# Patient Record
Sex: Male | Born: 1980 | Race: White | Hispanic: No | Marital: Married | Smoking: Former smoker
Health system: Southern US, Community
[De-identification: ages and names within clinical notes are randomized; demographics above are authoritative.]

## PROBLEM LIST (undated history)

## (undated) DIAGNOSIS — I8390 Asymptomatic varicose veins of unspecified lower extremity: Secondary | ICD-10-CM

## (undated) HISTORY — DX: Asymptomatic varicose veins of unspecified lower extremity: I83.90

---

## 2014-10-09 ENCOUNTER — Telehealth: Payer: Self-pay | Admitting: Family Medicine

## 2014-10-09 NOTE — Telephone Encounter (Signed)
Patient advised to go to urgent care

## 2014-10-16 ENCOUNTER — Telehealth: Payer: Self-pay | Admitting: Family Medicine

## 2014-10-16 NOTE — Telephone Encounter (Signed)
Patient has [employer] and is currently not on any medications. Patient scheduled for 3/8 with Dr. Darlyn ReadStacks.

## 2014-11-26 ENCOUNTER — Emergency Department (HOSPITAL_COMMUNITY): Payer: Worker's Compensation

## 2014-11-26 ENCOUNTER — Encounter (HOSPITAL_COMMUNITY): Payer: Self-pay | Admitting: Emergency Medicine

## 2014-11-26 ENCOUNTER — Emergency Department (HOSPITAL_COMMUNITY)
Admission: EM | Admit: 2014-11-26 | Discharge: 2014-11-26 | Disposition: A | Discharge status: Home / Self Care | Payer: Worker's Compensation | Attending: Emergency Medicine | Admitting: Emergency Medicine

## 2014-11-26 DIAGNOSIS — Y9389 Activity, other specified: Secondary | ICD-10-CM | POA: Insufficient documentation

## 2014-11-26 DIAGNOSIS — Z87891 Personal history of nicotine dependence: Secondary | ICD-10-CM | POA: Diagnosis not present

## 2014-11-26 DIAGNOSIS — S40012A Contusion of left shoulder, initial encounter: Secondary | ICD-10-CM | POA: Diagnosis not present

## 2014-11-26 DIAGNOSIS — S4992XA Unspecified injury of left shoulder and upper arm, initial encounter: Secondary | ICD-10-CM | POA: Diagnosis present

## 2014-11-26 DIAGNOSIS — S8002XA Contusion of left knee, initial encounter: Secondary | ICD-10-CM | POA: Diagnosis not present

## 2014-11-26 DIAGNOSIS — Y998 Other external cause status: Secondary | ICD-10-CM | POA: Insufficient documentation

## 2014-11-26 DIAGNOSIS — W228XXA Striking against or struck by other objects, initial encounter: Secondary | ICD-10-CM | POA: Insufficient documentation

## 2014-11-26 DIAGNOSIS — S8001XA Contusion of right knee, initial encounter: Secondary | ICD-10-CM | POA: Diagnosis not present

## 2014-11-26 DIAGNOSIS — Y9289 Other specified places as the place of occurrence of the external cause: Secondary | ICD-10-CM | POA: Insufficient documentation

## 2014-11-26 DIAGNOSIS — S40022A Contusion of left upper arm, initial encounter: Secondary | ICD-10-CM

## 2014-11-26 MED ORDER — HYDROCODONE-ACETAMINOPHEN 5-325 MG PO TABS: ORAL_TABLET | ORAL | Status: DC

## 2014-11-26 MED ORDER — CELECOXIB 100 MG PO CAPS: 100.0000 mg | ORAL_CAPSULE | Freq: Two times a day (BID) | ORAL | Status: DC

## 2014-11-26 NOTE — Discharge Instructions (Signed)
Your shoulder xray is negative for fracture or dislocation. Please rest your shoulder. Apply ice today, then alternate heat and ice tomorrow. Celebrex daily with food. Use norco at bed-time if needed,or may use every 4 hours for pain not relieved by tylenol or ibuprofen. Please see Dr Hilda LiasKeeling for orthopedic evaluation if not improving. Contusion A contusion is a deep bruise. Contusions happen when an injury causes bleeding under the skin. Signs of bruising include pain, puffiness (swelling), and discolored skin. The contusion may turn blue, purple, or yellow. HOME CARE   Put ice on the injured area.  Put ice in a plastic bag.  Place a towel between your skin and the bag.  Leave the ice on for 15-20 minutes, 03-04 times a day.  Only take medicine as told by your doctor.  Rest the injured area.  If possible, raise (elevate) the injured area to lessen puffiness. GET HELP RIGHT AWAY IF:   You have more bruising or puffiness.  You have pain that is getting worse.  Your puffiness or pain is not helped by medicine. MAKE SURE YOU:   Understand these instructions.  Will watch your condition.  Will get help right away if you are not doing well or get worse. Document Released: 03/15/2008 Document Revised: 12/20/2011 Document Reviewed: 08/02/2011 [facility] Patient Information 2015 [place]. This information is not intended to replace advice given to you by your health care provider. Make sure you discuss any questions you have with your health care provider.

## 2014-11-26 NOTE — ED Provider Notes (Signed)
CSN: [id number]     Arrival date & time 11/26/14  1329 History   First MD Initiated Contact with Patient 11/26/14 1617     Chief Complaint  Patient presents with  . Knee Injury  . 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 No LOC reported.  Patient is a 34 y.o. male presenting with shoulder injury. The history is provided by the patient.  Shoulder Injury This is a new problem. The current episode started today. The problem occurs intermittently. The problem has been gradually worsening. Associated symptoms include arthralgias. Pertinent negatives include no abdominal pain, nausea, numbness, vomiting or weakness. Exacerbated by: certain movement. He has tried nothing for the symptoms. The treatment provided no relief.    History reviewed. No pertinent past medical history. History reviewed. No pertinent past surgical history. Family History  Problem Relation Age of Onset  . Heart failure Father    History  Substance Use Topics  . Smoking status: Former Games developermoker  . Smokeless tobacco: Not on file  . Alcohol Use: 6.0 oz/week    10 Cans of beer per week    Review of Systems  Gastrointestinal: Negative for nausea, vomiting and abdominal pain.  Musculoskeletal: Positive for arthralgias.  Neurological: Negative for weakness and numbness.      Allergies  Review of patient's allergies indicates no known allergies.  Home Medications   Prior to Admission medications   Not on File   BP 119/86 mmHg  Pulse 74  Temp(Src) 97.7 F (36.5 C) (Oral)  Resp 16  Ht 6' (1.829 m)  Wt 215 lb (97.523 kg)  BMI 29.15 kg/m2  SpO2 99% Physical Exam  Constitutional: He is oriented to person, place, and time.  He appears well-developed and well-nourished.  Non-toxic appearance.  HENT:  Head: Normocephalic.  Right Ear: Tympanic membrane and external ear normal.  Left Ear: Tympanic membrane and external ear normal.  Eyes: EOM and lids are normal. Pupils are equal, round, and reactive to light.  Neck: Normal range of motion. Neck supple. Carotid bruit is not present.  Cardiovascular: Normal rate, regular rhythm, normal heart sounds, intact distal pulses and normal pulses.   Pulmonary/Chest: Breath sounds normal. No respiratory distress.  Abdominal: Soft. Bowel sounds are normal. There is no tenderness. There is no guarding.  Musculoskeletal:       Left shoulder: He exhibits decreased range of motion, tenderness and pain. He exhibits no deformity.  Soreness of both knees. No deformity or effusion. Distal pulses wnl.  Lymphadenopathy:       Head (right side): No submandibular adenopathy present.       Head (left side): No submandibular adenopathy present.    He has no cervical adenopathy.  Neurological: He is alert and oriented to person, place, and time. He has normal strength. No cranial nerve deficit or sensory deficit.  Skin: Skin is warm and dry.  Psychiatric: He has a normal mood and affect. His speech is normal.  Nursing note and vitals reviewed.   ED Course  Procedures (including critical care time) Labs Review Labs Reviewed - No data to display  Imaging Review No results found.   EKG Interpretation None      MDM  Xray of the shoulder is negative for fx  or dislocation. Pt is ambulatory without problem. No gross motor or sensory deficit. Rx for celebrex and norco given to the patient. Work excuse for 2 days given.   Final diagnoses:  None    *I have reviewed nursing notes, vital signs, and all appropriate lab and imaging results for this patient.**    Kathie Dike, PA-C 11/28/14 1139  Samuel Jester, DO 11/28/14 [phone]

## 2014-11-26 NOTE — ED Notes (Signed)
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  Rates pain shoulder 5 and knee 2.  Have not taken any pain medication.

## 2014-12-17 ENCOUNTER — Ambulatory Visit (INDEPENDENT_AMBULATORY_CARE_PROVIDER_SITE_OTHER): Payer: BLUE CROSS/BLUE SHIELD | Admitting: Family Medicine

## 2014-12-17 ENCOUNTER — Encounter: Payer: Self-pay | Admitting: Family Medicine

## 2014-12-17 VITALS — BP 117/78 | HR 86 | Temp 98.1°F | Ht 69.25 in | Wt 217.6 lb

## 2014-12-17 DIAGNOSIS — Z0000 Encounter for general adult medical examination without abnormal findings: Secondary | ICD-10-CM | POA: Diagnosis not present

## 2014-12-17 DIAGNOSIS — I872 Venous insufficiency (chronic) (peripheral): Secondary | ICD-10-CM | POA: Diagnosis not present

## 2014-12-17 DIAGNOSIS — N5089 Other specified disorders of the male genital organs: Secondary | ICD-10-CM

## 2014-12-17 DIAGNOSIS — Z23 Encounter for immunization: Secondary | ICD-10-CM

## 2014-12-17 DIAGNOSIS — N508 Other specified disorders of male genital organs: Secondary | ICD-10-CM

## 2014-12-17 MED ORDER — AZITHROMYCIN 250 MG PO TABS: ORAL_TABLET | ORAL | Status: DC

## 2014-12-17 NOTE — Patient Instructions (Signed)
Exercise to Lose Weight Exercise and a healthy diet may help you lose weight. Your doctor may suggest specific exercises. EXERCISE IDEAS AND TIPS  Choose low-cost things you enjoy doing, such as walking, bicycling, or exercising to workout videos.  Take stairs instead of the elevator.  Walk during your lunch break.  Park your car further away from work or school.  Go to a gym or an exercise class.  Start with 5 to 10 minutes of exercise each day. Build up to 30 minutes of exercise 4 to 6 days a week.  Wear shoes with good support and comfortable clothes.  Stretch before and after working out.  Work out until you breathe harder and your heart beats faster.  Drink extra water when you exercise.  Do not do so much that you hurt yourself, feel dizzy, or get very short of breath. Exercises that burn about 150 calories:  Running 1  miles in 15 minutes.  Playing volleyball for 45 to 60 minutes.  Washing and waxing a car for 45 to 60 minutes.  Playing touch football for 45 minutes.  Walking 1  miles in 35 minutes.  Pushing a stroller 1  miles in 30 minutes.  Playing basketball for 30 minutes.  Raking leaves for 30 minutes.  Bicycling 5 miles in 30 minutes.  Walking 2 miles in 30 minutes.  Dancing for 30 minutes.  Shoveling snow for 15 minutes.  Swimming laps for 20 minutes.  Walking up stairs for 15 minutes.  Bicycling 4 miles in 15 minutes.  Gardening for 30 to 45 minutes.  Jumping rope for 15 minutes.  Washing windows or floors for 45 to 60 minutes. Document Released: 10/30/2010 Document Revised: 12/20/2011 Document Reviewed: 10/30/2010 ExitCare Patient Information 2015 [employer]. This information is not intended to replace advice given to you by your health care provider. Make sure you discuss any questions you have with your health care provider. Calorie Counting for Weight Loss Calories are energy you get from the things you eat and drink. Your  body uses this energy to keep you going throughout the day. The number of calories you eat affects your weight. When you eat more calories than your body needs, your body stores the extra calories as fat. When you eat fewer calories than your body needs, your body burns fat to get the energy it needs. Calorie counting means keeping track of how many calories you eat and drink each day. If you make sure to eat fewer calories than your body needs, you should lose weight. In order for calorie counting to work, you will need to eat the number of calories that are right for you in a day to lose a healthy amount of weight per week. A healthy amount of weight to lose per week is usually 1-2 lb (0.5-0.9 kg). A dietitian can determine how many calories you need in a day and give you suggestions on how to reach your calorie goal.  WHAT IS MY MY PLAN? My goal is to have __________ calories per day.  If I have this many calories per day, I should lose around __________ pounds per week. WHAT DO I NEED TO KNOW ABOUT CALORIE COUNTING? In order to meet your daily calorie goal, you will need to:  Find out how many calories are in each food you would like to eat. Try to do this before you eat.  Decide how much of the food you can eat.  Write down what you ate and   how many calories it had. Doing this is called keeping a food log. WHERE DO I FIND CALORIE INFORMATION? The number of calories in a food can be found on a Nutrition Facts label. Note that all the information on a label is based on a specific serving of the food. If a food does not have a Nutrition Facts label, try to look up the calories online or ask your dietitian for help. HOW DO I DECIDE HOW MUCH TO EAT? To decide how much of the food you can eat, you will need to consider both the number of calories in one serving and the size of one serving. This information can be found on the Nutrition Facts label. If a food does not have a Nutrition Facts label, look  up the information online or ask your dietitian for help. Remember that calories are listed per serving. If you choose to have more than one serving of a food, you will have to multiply the calories per serving by the amount of servings you plan to eat. For example, the label on a package of bread might say that a serving size is 1 slice and that there are 90 calories in a serving. If you eat 1 slice, you will have eaten 90 calories. If you eat 2 slices, you will have eaten 180 calories. HOW DO I KEEP A FOOD LOG? After each meal, record the following information in your food log:  What you ate.  How much of it you ate.  How many calories it had.  Then, add up your calories. Keep your food log near you, such as in a small notebook in your pocket. Another option is to use a mobile app or website. Some programs will calculate calories for you and show you how many calories you have left each time you add an item to the log. WHAT ARE SOME CALORIE COUNTING TIPS?  Use your calories on foods and drinks that will fill you up and not leave you hungry. Some examples of this include foods like nuts and nut butters, vegetables, lean proteins, and high-fiber foods (more than 5 g fiber per serving).  Eat nutritious foods and avoid empty calories. Empty calories are calories you get from foods or beverages that do not have many nutrients, such as candy and soda. It is better to have a nutritious high-calorie food (such as an avocado) than a food with few nutrients (such as a bag of chips).  Know how many calories are in the foods you eat most often. This way, you do not have to look up how many calories they have each time you eat them.  Look out for foods that may seem like low-calorie foods but are really high-calorie foods, such as baked goods, soda, and fat-free candy.  Pay attention to calories in drinks. Drinks such as sodas, specialty coffee drinks, alcohol, and juices have a lot of calories yet do  not fill you up. Choose low-calorie drinks like water and diet drinks.  Focus your calorie counting efforts on higher calorie items. Logging the calories in a garden salad that contains only vegetables is less important than calculating the calories in a milk shake.  Find a way of tracking calories that works for you. Get creative. Most people who are successful find ways to keep track of how much they eat in a day, even if they do not count every calorie. WHAT ARE SOME PORTION CONTROL TIPS?  Know how many calories are in a   serving. This will help you know how many servings of a certain food you can have.  Use a measuring cup to measure serving sizes. This is helpful when you start out. With time, you will be able to estimate serving sizes for some foods.  Take some time to put servings of different foods on your favorite plates, bowls, and cups so you know what a serving looks like.  Try not to eat straight from a bag or box. Doing this can lead to overeating. Put the amount you would like to eat in a cup or on a plate to make sure you are eating the right portion.  Use smaller plates, glasses, and bowls to prevent overeating. This is a quick and easy way to practice portion control. If your plate is smaller, less food can fit on it.  Try not to multitask while eating, such as watching TV or using your computer. If it is time to eat, sit down at a table and enjoy your food. Doing this will help you to start recognizing when you are full. It will also make you more aware of what and how much you are eating. HOW CAN I CALORIE COUNT WHEN EATING OUT?  Ask for smaller portion sizes or child-sized portions.  Consider sharing an entree and sides instead of getting your own entree.  If you get your own entree, eat only half. Ask for a box at the beginning of your meal and put the rest of your entree in it so you are not tempted to eat it.  Look for the calories on the menu. If calories are listed,  choose the lower calorie options.  Choose dishes that include vegetables, fruits, whole grains, low-fat dairy products, and lean protein. Focusing on smart food choices from each of the 5 food groups can help you stay on track at restaurants.  Choose items that are boiled, broiled, grilled, or steamed.  Choose water, milk, unsweetened iced tea, or other drinks without added sugars. If you want an alcoholic beverage, choose a lower calorie option. For example, a regular margarita can have up to 700 calories and a glass of wine has around 150.  Stay away from items that are buttered, battered, fried, or served with cream sauce. Items labeled "crispy" are usually fried, unless stated otherwise.  Ask for dressings, sauces, and syrups on the side. These are usually very high in calories, so do not eat much of them.  Watch out for salads. Many people think salads are a healthy option, but this is often not the case. Many salads come with bacon, fried chicken, lots of cheese, fried chips, and dressing. All of these items have a lot of calories. If you want a salad, choose a garden salad and ask for grilled meats or steak. Ask for the dressing on the side, or ask for olive oil and vinegar or lemon to use as dressing.  Estimate how many servings of a food you are given. For example, a serving of cooked rice is  cup or about the size of half a tennis ball or one cupcake wrapper. Knowing serving sizes will help you be aware of how much food you are eating at restaurants. The list below tells you how big or small some common portion sizes are based on everyday objects.  1 oz--4 stacked dice.  3 oz--1 deck of cards.  1 tsp--1 dice.  1 Tbsp-- a Ping-Pong ball.  2 Tbsp--1 Ping-Pong ball.   cup--1 tennis ball   or 1 cupcake wrapper.  1 cup--1 baseball. Document Released: 09/27/2005 Document Revised: 02/11/2014 Document Reviewed: 08/02/2013 ExitCare Patient Information 2015 [employer]. This  information is not intended to replace advice given to you by your health care provider. Make sure you discuss any questions you have with your health care provider.  
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
## 2014-12-21 ENCOUNTER — Other Ambulatory Visit: Payer: BLUE CROSS/BLUE SHIELD

## 2014-12-21 LAB — POCT CBC
Granulocyte percent: 57.9 %G (ref 37–80)
HCT, POC: 47.9 % (ref 43.5–53.7)
HEMOGLOBIN: 15.2 g/dL (ref 14.1–18.1)
Lymph, poc: 1.9 (ref 0.6–3.4)
MCH, POC: 27.9 pg (ref 27–31.2)
MCHC: 31.7 g/dL — AB (ref 31.8–35.4)
MCV: 87.8 fL (ref 80–97)
MPV: 6.7 fL (ref 0–99.8)
POC Granulocyte: 3.2 (ref 2–6.9)
POC LYMPH %: 34.8 % (ref 10–50)
Platelet Count, POC: 294 10*3/uL (ref 142–424)
RBC: 5.46 M/uL (ref 4.69–6.13)
RDW, POC: 12.7 %
WBC: 5.5 10*3/uL (ref 4.6–10.2)

## 2014-12-21 NOTE — Progress Notes (Signed)
Lab draw that were ordered early in week by dr. Darlyn Readstacks

## 2014-12-21 NOTE — Addendum Note (Signed)
Addended by: Prescott GumLAND, Purva Vessell M on: 12/21/2014 08:35 AM   Modules accepted: Kipp BroodSmartSet

## 2014-12-23 LAB — NMR, LIPOPROFILE
Cholesterol: 165 mg/dL (ref 100–199)
HDL Cholesterol by NMR: 58 mg/dL (ref >39)
HDL Particle Number: 33.2 umol/L (ref >=30.5)
LDL Particle Number: 1219 nmol/L — ABNORMAL HIGH (ref <1000)
LDL SIZE: 21.2 nm (ref >20.5)
LDL-C: 99 mg/dL (ref 0–99)
LP-IR Score: 29 (ref <=45)
SMALL LDL PARTICLE NUMBER: 274 nmol/L (ref <=527)
TRIGLYCERIDES BY NMR: 38 mg/dL (ref 0–149)

## 2014-12-23 LAB — CMP14+EGFR
A/G RATIO: 1.8 (ref 1.1–2.5)
ALBUMIN: 4.2 g/dL (ref 3.5–5.5)
ALT: 21 IU/L (ref 0–44)
AST: 19 IU/L (ref 0–40)
Alkaline Phosphatase: 72 IU/L (ref 39–117)
BUN/Creatinine Ratio: 14 (ref 8–19)
BUN: 14 mg/dL (ref 6–20)
Bilirubin Total: 0.3 mg/dL (ref 0.0–1.2)
CALCIUM: 9.1 mg/dL (ref 8.7–10.2)
CHLORIDE: 103 mmol/L (ref 97–108)
CO2: 23 mmol/L (ref 18–29)
Creatinine, Ser: 0.98 mg/dL (ref 0.76–1.27)
GFR calc Af Amer: 116 mL/min/{1.73_m2} (ref >59)
GFR calc non Af Amer: 100 mL/min/{1.73_m2} (ref >59)
Globulin, Total: 2.4 g/dL (ref 1.5–4.5)
Glucose: 92 mg/dL (ref 65–99)
POTASSIUM: 4.2 mmol/L (ref 3.5–5.2)
Sodium: 140 mmol/L (ref 134–144)
TOTAL PROTEIN: 6.6 g/dL (ref 6.0–8.5)

## 2014-12-24 ENCOUNTER — Ambulatory Visit: Payer: Worker's Compensation | Attending: Family Medicine | Admitting: Physical Therapy

## 2014-12-24 DIAGNOSIS — M25512 Pain in left shoulder: Secondary | ICD-10-CM | POA: Insufficient documentation

## 2014-12-24 NOTE — Therapy (Signed)
[facility] [address] [place], [place], [address] Phone: [phone]   Fax:  [phone]  Physical Therapy Evaluation  Patient Details  Name: Ruben Mejia MRN: [id number] Date of Birth: 1981-06-19 Referring Provider:  Ernestina PennaMoore, Donald W, MD  Encounter Date: 12/24/2014      PT End of Session - 12/24/14 1419    Visit Number 1   Number of Visits 12   Date for PT Re-Evaluation 01/21/15   Authorization - Visit Number --   PT Start Time 0152   PT Stop Time 0239   PT Time Calculation (min) 47 min   Activity Tolerance Patient tolerated treatment well   Behavior During Therapy [facility] for tasks assessed/performed      No past medical history on file.  No past surgical history on file.  There were no vitals filed for this visit.  Visit Diagnosis:  Left shoulder pain - Plan: PT plan of care cert/re-cert      Subjective Assessment - 12/24/14 1413    Symptoms Minimal pain at rest.   Patient Stated Goals Get out of pain.   Currently in Pain? Yes   Pain Score 2    Pain Location Shoulder   Pain Orientation Left   Pain Descriptors / Indicators Sharp   Pain Type Acute pain   Pain Onset 1 to 4 weeks ago   Pain Frequency Constant   Aggravating Factors  Lifting objects at work.   Pain Relieving Factors Rest arm.            [facility] PT Assessment - 12/24/14 0001    Assessment   Medical Diagnosis Left shoulder strain/contusion.   Onset Date 11/26/14   Balance Screen   Has the patient fallen in the past 6 months No   Has the patient had a decrease in activity level because of a fear of falling?  No   Is the patient reluctant to leave their home because of a fear of falling?  No   Posture/Postural Control   Posture/Postural Control Postural limitations   Postural Limitations Rounded Shoulders   Posture Comments Protracted scapulae.   ROM / Strength   AROM / PROM / Strength AROM;Strength   AROM   Overall AROM Comments Normal left UE  AROM.   Strength   Overall Strength Comments Left ER strength= 4+/5.   Palpation   Palpation Tender to palpation over left posterior cuff region.   Special Tests    Special Tests --  Negative special testing.  Normal UE DTR's.                   [facility] Treatment/Exercise - 12/24/14 0001    Modalities   Modalities Electrical Stimulation   Electrical Stimulation   Electrical Stimulation Location Left shoulder.   Electrical Stimulation Parameters Pre-mod 80-150 Hz constant x 20 minutes.   Electrical Stimulation Goals Pain                     PT Long Term Goals - 12/24/14 1421    PT LONG TERM GOAL #1   Title Ind with HEP.   PT LONG TERM GOAL #2   Title Perform all ADL's with pain not > 1-2/10.   PT LONG TERM GOAL #3   Title Sleep undisturbed.   Time 6   Period Weeks   Status New               Plan - 12/24/14 1357  Clinical Impression Statement Forlift accident (11/26/14) and fell on left shoulder.  To hospital via ED.  Resting pain-level low 1-2/10.  Certain movements produce left shoulder pain ("sharp") rated at 5/10.  The patient's sleep is disturbed by pain.   Rehab Potential Excellent   PT Duration --  12 visits.   PT Treatment/Interventions Therapeutic activities;Patient/family education;Therapeutic exercise;Ultrasound;Manual techniques;Cryotherapy;Electrical Stimulation   PT Next Visit Plan Left shoulder modalites and STW/M; RW4 and SDLY ER.   Consulted and Agree with Plan of Care Patient         Problem List There are no active problems to display for this patient.   Tristan Bramble, [place] MPT 12/24/2014, 2:45 PM  [facility] [address] [place], [place], [address] Phone: [phone]   Fax:  [phone]

## 2014-12-25 ENCOUNTER — Ambulatory Visit (HOSPITAL_COMMUNITY)
Admission: RE | Admit: 2014-12-25 | Discharge: 2014-12-25 | Disposition: A | Discharge status: Home / Self Care | Payer: BLUE CROSS/BLUE SHIELD | Source: Ambulatory Visit | Attending: Family Medicine | Admitting: Family Medicine

## 2014-12-25 ENCOUNTER — Other Ambulatory Visit: Payer: Self-pay | Admitting: Family Medicine

## 2014-12-25 DIAGNOSIS — I861 Scrotal varices: Secondary | ICD-10-CM | POA: Diagnosis not present

## 2014-12-25 DIAGNOSIS — N508 Other specified disorders of male genital organs: Secondary | ICD-10-CM | POA: Diagnosis present

## 2014-12-25 DIAGNOSIS — N5089 Other specified disorders of the male genital organs: Secondary | ICD-10-CM

## 2014-12-27 ENCOUNTER — Other Ambulatory Visit: Payer: Self-pay

## 2014-12-27 DIAGNOSIS — I872 Venous insufficiency (chronic) (peripheral): Secondary | ICD-10-CM

## 2014-12-30 ENCOUNTER — Ambulatory Visit: Payer: Worker's Compensation | Admitting: Physical Therapy

## 2014-12-30 ENCOUNTER — Encounter: Payer: Self-pay | Admitting: Physical Therapy

## 2014-12-30 DIAGNOSIS — M25512 Pain in left shoulder: Secondary | ICD-10-CM | POA: Diagnosis not present

## 2014-12-30 NOTE — Therapy (Signed)
[facility] [address] [place], [place], [address] Phone: [phone]   Fax:  [phone]  Physical Therapy Treatment  Patient Details  Name: Ruben Mejia MRN: [id number] Date of Birth: Jun 19, 1981 Referring Provider:  Mechele Claude, MD  Encounter Date: 12/30/2014      PT End of Session - 12/30/14 1516    Visit Number 2   Number of Visits 12   Date for PT Re-Evaluation 01/21/15   PT Start Time 1512   PT Stop Time 1556   PT Time Calculation (min) 44 min   Activity Tolerance Patient tolerated treatment well   Behavior During Therapy [facility] for tasks assessed/performed      No past medical history on file.  No past surgical history on file.  There were no vitals filed for this visit.  Visit Diagnosis:  Left shoulder pain      Subjective Assessment - 12/30/14 1513    Symptoms Patient states that his shoulder has been doing good. He is on light duty at work still. Reports compliance with the exercises that Dr. Christell Constant gave him (wall climbs, "rotating shoulder around").   Currently in Pain? No/denies                       [facility] Adult PT Treatment/Exercise - 12/30/14 0001    Exercises   Exercises Shoulder   Shoulder Exercises: Standing   External Rotation Strengthening;Left;10 reps;Theraband  3 sets   Theraband Level (Shoulder External Rotation) Level 2 (Red)   Internal Rotation Strengthening;Left;10 reps;Theraband  3 sets   Theraband Level (Shoulder Internal Rotation) Level 2 (Red)   Extension Strengthening;Both;10 reps  3 sets   Extension Limitations Pink XTS   Row Strengthening;Both;10 reps;Limitations  3 sets   Row Limitations Pink XTS   Shoulder Exercises: ROM/Strengthening   UBE (Upper Arm Bike) 90 RPMs x 8 minutes   Shoulder Exercises: Stretch   Corner Stretch 3 reps;30 seconds   Modalities   Modalities Electrical Stimulation   Electrical Stimulation   Electrical Stimulation Location Left  shoulder.   Electrical Stimulation Action Pre-Mod   Electrical Stimulation Parameters 80-150 Hz constant x 20 minutes   Electrical Stimulation Goals Pain                PT Education - 12/30/14 1553    Education provided Yes   Education Details HEP- corner stretch, ER, IR, rows, extension with theraband. Red theraband given   Person(s) Educated Patient   Methods Explanation;Demonstration;Handout   Comprehension Verbalized understanding;Returned demonstration             PT Long Term Goals - 12/30/14 1538    PT LONG TERM GOAL #1   Title Ind with HEP.   Time 6   Period Weeks   Status On-going   PT LONG TERM GOAL #2   Title Perform all ADL's with pain not > 1-2/10.   Time 6   Period Weeks   Status On-going   PT LONG TERM GOAL #3   Title Sleep undisturbed.   Time 6   Period Weeks   Status On-going               Plan - 12/30/14 1558    Clinical Impression Statement Patient tolerated treatment well and accepted new exercises. Patient experienced dull, ache pain (2-3/10) in left shoulder during exercises especially ER. All goals remain on-going at this time. Patient denied ice pack during e-stim. Patient reported 1/10 pain in left shoulder  following e-stim.    Rehab Potential Excellent   PT Treatment/Interventions Therapeutic activities;Patient/family education;Therapeutic exercise;Ultrasound;Manual techniques;Cryotherapy;Electrical Stimulation   PT Next Visit Plan Continue per PT POC. Continue strengthening for L shoulder.    Consulted and Agree with Plan of Care Patient        Problem List There are no active problems to display for this patient.   Evelene CroonKelsey M Parsons, PTA 12/30/2014, 4:02 PM  [facility] [address] [place], [place], [address] Phone: [phone]   Fax:  [phone]

## 2014-12-30 NOTE — Patient Instructions (Signed)
Flexibility: Corner Stretch   Standing in corner with hands just above shoulder level and feet ____ inches from corner, lean forward until a comfortable stretch is felt across chest. Hold _30___ seconds. Repeat _3___ times per set. Do _1___ sets per session. Do _2___ sessions per day.  http://orth.exer.us/342   Copyright  VHI. All rights reserved.  Strengthening: Resisted External Rotation   Hold tubing in right hand, elbow at side and forearm across body. Rotate forearm out. Repeat __10__ times per set. Do __3__ sets per session. Do _2___ sessions per day.  http://orth.exer.us/828   Copyright  VHI. All rights reserved.  Strengthening: Resisted Internal Rotation   Hold tubing in left hand, elbow at side and forearm out. Rotate forearm in across body. Repeat _10___ times per set. Do __3__ sets per session. Do _2___ sessions per day.  http://orth.exer.us/830     Copyright  VHI. All rights reserved.  Scapular Retraction: Bilateral   Facing anchor, pull arms back, bringing shoulder blades together. Repeat _10___ times per set. Do __3__ sets per session. Do _2___ sessions per day.  http://orth.exer.us/176   Copyright  VHI. All rights reserved.  Strengthening: Resisted Extension   Hold tubing in right hand, arm forward. Pull arm back, elbow straight. Repeat ___10_ times per set. Do ___3_ sets per session. Do _2___ sessions per day.  http://orth.exer.us/832   Copyright  VHI. All rights reserved.

## 2014-12-31 ENCOUNTER — Telehealth: Payer: Self-pay | Admitting: Family Medicine

## 2014-12-31 ENCOUNTER — Encounter: Payer: Self-pay | Admitting: Family Medicine

## 2014-12-31 NOTE — Telephone Encounter (Signed)
-----   Message from Mechele ClaudeWarren Stacks, MD sent at 12/25/2014  5:45 PM EDT ----- Jerilynn SomHello [place], The left testicle has a harmless cyst that you are feeling.  Best Regards, Mechele ClaudeWarren Stacks, M.D.

## 2015-01-02 ENCOUNTER — Ambulatory Visit: Payer: Worker's Compensation | Admitting: Physical Therapy

## 2015-01-02 ENCOUNTER — Encounter: Payer: Self-pay | Admitting: Physical Therapy

## 2015-01-02 DIAGNOSIS — M25512 Pain in left shoulder: Secondary | ICD-10-CM | POA: Diagnosis not present

## 2015-01-02 NOTE — Therapy (Signed)
[facility] [place], [place], [address] Phone: [phone]   Fax:  [phone]  Physical Therapy Treatment  Patient Details  Name: Ruben Mejia MRN: [id number] Date of Birth: Sep 26, 1981 Referring Provider:  Claretta Fraise, MD  Encounter Date: 01/02/2015      PT End of Session - 01/02/15 1516    Visit Number 3   Number of Visits 12   Date for PT Re-Evaluation 01/21/15   PT Start Time 8466   PT Stop Time 1600   PT Time Calculation (min) 47 min   Activity Tolerance Patient tolerated treatment well   Behavior During Therapy [facility] for tasks assessed/performed      No past medical history on file.  No past surgical history on file.  There were no vitals filed for this visit.  Visit Diagnosis:  Left shoulder pain      Subjective Assessment - 01/02/15 1515    Symptoms Reports he has been constantly sore after the last treatment. Has been completing his HEP as instructed. Reported tightness along posterior capsule. States that greatest limitation is lifting ~30# overhead at work.  Stated he has a TENS unit at home and will bring it to next appointment.   Patient Stated Goals Get out of pain.   Currently in Pain? Yes   Pain Score 1    Pain Location Shoulder   Pain Orientation Left   Pain Descriptors / Indicators Sore   Pain Onset 1 to 4 weeks ago   Pain Frequency Constant            [facility] PT Assessment - 01/02/15 0001    Assessment   Medical Diagnosis Left shoulder strain/contusion.   Onset Date 11/26/14                   [facility] Adult PT Treatment/Exercise - 01/02/15 0001    Shoulder Exercises: Supine   Flexion Strengthening;Left;20 reps;Weights  Lawnchair with wedge   Shoulder Flexion Weight (lbs) 2   Other Supine Exercises Serratus Punch 2# 20 reps   Other Supine Exercises Scaption 2# 20 reps; lawnchair with wedge   Shoulder Exercises: Sidelying   External Rotation Strengthening;Left;20  reps;Weights   External Rotation Weight (lbs) 2   Shoulder Exercises: Standing   Horizontal ABduction Strengthening;Left;20 reps;Weights   Horizontal ABduction Weight (lbs) 2   Extension Strengthening;Left;20 reps;Weights   Extension Weight (lbs) 2   Row Strengthening;Left;20 reps;Weights   Row Weight (lbs) 2   Shoulder Exercises: ROM/Strengthening   UBE (Upper Arm Bike) 90 RPMs x 10 minutes   Shoulder Exercises: Stretch   Cross Chest Stretch 3 reps;30 seconds   Modalities   Modalities Cryotherapy;Electrical Stimulation   Cryotherapy   Number Minutes Cryotherapy 15 Minutes   Cryotherapy Location Shoulder   Type of Cryotherapy Ice pack   Electrical Stimulation   Electrical Stimulation Location Left shoulder.   Electrical Stimulation Action Pre-mod   Electrical Stimulation Parameters 80-150 Hz constant x 15 minutes   Electrical Stimulation Goals Pain                PT Education - 01/02/15 1548    Education provided Yes   Education Details Educated to complete 20 reps of HEP until soreness subsides and to ice 10-15 minutes as symptoms dictate.   Person(s) Educated Patient   Methods Explanation   Comprehension Verbalized understanding             PT Long Term Goals - 01/02/15 1522  PT LONG TERM GOAL #1   Title Ind with HEP.   Time 6   Period Weeks   Status Achieved   PT LONG TERM GOAL #2   Title Perform all ADL's with pain not > 1-2/10.   Time 6   Period Weeks   Status On-going   PT LONG TERM GOAL #3   Title Sleep undisturbed.   Time 6   Period Weeks   Status On-going  Reports not sleeping completely through the night yet               Plan - 01/02/15 1543    Clinical Impression Statement Treatment was not as intense today due to soreness reported. Patient experienced slight pain during lawnchair scaption. Patient met goal #1 (independence with HEP) during today's session. Cryotherapy and e-stim were applied today to decrease pain in L  shoulder.    Rehab Potential Excellent   PT Treatment/Interventions Therapeutic activities;Patient/family education;Therapeutic exercise;Ultrasound;Manual techniques;Cryotherapy;Electrical Stimulation   PT Next Visit Plan Continue per PT POC. Continue strengthening for L shoulder and stretching to decrease tightness.    Consulted and Agree with Plan of Care Patient        Problem List There are no active problems to display for this patient.   Wynelle Fanny, PTA 01/02/2015, 4:03 PM  [facility] [address] [place], [place], [address] Phone: [phone]   Fax:  [phone]

## 2015-01-06 ENCOUNTER — Encounter: Payer: Self-pay | Admitting: Physical Therapy

## 2015-01-06 ENCOUNTER — Ambulatory Visit: Payer: Worker's Compensation | Admitting: Physical Therapy

## 2015-01-06 DIAGNOSIS — M25512 Pain in left shoulder: Secondary | ICD-10-CM

## 2015-01-06 NOTE — Therapy (Signed)
[facility] [address] [place], [place], [address] Phone: [phone]   Fax:  [phone]  Physical Therapy Treatment  Patient Details  Name: Ruben Mejia MRN: [id number] Date of Birth: [birth year] Referring Provider:  Ernestina Penna, MD  Encounter Date: 01/06/2015      PT End of Session - 01/06/15 1605    Visit Number 4   Number of Visits 12   Date for PT Re-Evaluation 01/21/15   PT Start Time 1602   PT Stop Time 1626   PT Time Calculation (min) 24 min   Activity Tolerance Patient tolerated treatment well   Behavior During Therapy [facility] for tasks assessed/performed      No past medical history on file.  No past surgical history on file.  There were no vitals filed for this visit.  Visit Diagnosis:  Left shoulder pain      Subjective Assessment - 01/06/15 1603    Symptoms Reports decreased soreness that was reported previously due to HEP. Reports soreness present today due to playing with neices and nephews yesterday and says that play got a little rough. States that he can tell of improvement with ROM.   Patient Stated Goals Get out of pain.   Currently in Pain? Yes   Pain Score 1    Pain Location Shoulder   Pain Orientation Left   Pain Descriptors / Indicators Sore   Pain Type Acute pain   Pain Onset 1 to 4 weeks ago   Pain Frequency Intermittent   Aggravating Factors  Lifting objects at work   Pain Relieving Factors Rest            [facility] PT Assessment - 01/06/15 0001    Assessment   Medical Diagnosis Left shoulder strain/contusion.   Onset Date 11/26/14                   [facility] Adult PT Treatment/Exercise - 01/06/15 0001    Shoulder Exercises: Supine   Other Supine Exercises Serratus Punch 2# 20 reps   Shoulder Exercises: Sidelying   External Rotation Strengthening;Left;20 reps;Weights   External Rotation Weight (lbs) 2   Shoulder Exercises: Standing   Horizontal ABduction  Strengthening;Left;20 reps;Weights   Horizontal ABduction Weight (lbs) 2   Flexion Strengthening;Left;20 reps;Weights   Shoulder Flexion Weight (lbs) 2   Extension Strengthening;Left;20 reps;Weights   Extension Weight (lbs) 2   Row Strengthening;Left;20 reps;Weights   Row Weight (lbs) 2   Other Standing Exercises Scaption 2# 2 sets 10 reps   Shoulder Exercises: ROM/Strengthening   UBE (Upper Arm Bike) 90 RPMs x 8 minutes   Shoulder Exercises: Stretch   Corner Stretch 3 reps;30 seconds   Cross Chest Stretch 3 reps;30 seconds                     PT Long Term Goals - 01/06/15 1606    PT LONG TERM GOAL #1   Title Ind with HEP.   Time 6   Period Weeks   Status Achieved   PT LONG TERM GOAL #2   Title Perform all ADL's with pain not > 1-2/10.   Time 6   Period Weeks   Status Achieved   PT LONG TERM GOAL #3   Title Sleep undisturbed.   Time 6   Period Weeks   Status Achieved               Plan - 01/06/15 1628    Clinical Impression Statement Per patient  reports of decreased soreness, treatment intensity remained at the same level today. Patient did not experience pain during exercises. All long term goals were acheived during today's session. Patient experienced 1/10 pain in R shoulder following treatment and denied modalities.   Rehab Potential Excellent   PT Treatment/Interventions Therapeutic activities;Patient/family education;Therapeutic exercise;Ultrasound;Manual techniques;Cryotherapy;Electrical Stimulation   PT Next Visit Plan Continue per PT POC. Continue strengthening for L shoulder and stretching to decrease tightness. Consider increasing weights of PREs next treatment.   Consulted and Agree with Plan of Care Patient        Problem List There are no active problems to display for this patient.   Evelene CroonKelsey M Parsons, PTA 01/06/2015, 4:36 PM  [facility] [address] [place], [place], [address] Phone:  [phone]   Fax:  [phone]

## 2015-01-09 ENCOUNTER — Ambulatory Visit: Payer: Worker's Compensation | Admitting: Physical Therapy

## 2015-01-09 DIAGNOSIS — M25512 Pain in left shoulder: Secondary | ICD-10-CM

## 2015-01-09 NOTE — Therapy (Signed)
[facility] [address] [place], [place], [address] Phone: [phone]   Fax:  [phone]  Physical Therapy Treatment  Patient Details  Name: Ruben Mejia MRN: [id number] Date of Birth: Jan 16, 1981 Referring Provider:  Mechele ClaudeStacks, Warren, MD  Encounter Date: 01/09/2015      PT End of Session - 01/09/15 1636    PT Start Time 0405   PT Stop Time 0436   PT Time Calculation (min) 31 min      No past medical history on file.  No past surgical history on file.  There were no vitals filed for this visit.  Visit Diagnosis:  Left shoulder pain      Subjective Assessment - 01/09/15 1610    Symptoms Shoulder is feeling a lot better.   Pain Score 1    Pain Location Shoulder   Pain Orientation Left   Pain Descriptors / Indicators Sore   Pain Onset 1 to 4 weeks ago   Pain Frequency Intermittent                                    PT Long Term Goals - 01/06/15 1606    PT LONG TERM GOAL #1   Title Ind with HEP.   Time 6   Period Weeks   Status Achieved   PT LONG TERM GOAL #2   Title Perform all ADL's with pain not > 1-2/10.   Time 6   Period Weeks   Status Achieved   PT LONG TERM GOAL #3   Title Sleep undisturbed.   Time 6   Period Weeks   Status Achieved     Treatment:  UBE @ 90 rpm's x 10 minutes.  RW 4 with red therband 2 x fatigue   3# scaption; flexion and abduction to 80 degrees.  3# left shoulder extension 2 x 20 reps.  3# ER @ 75 degrees of abduction 2 x 20 reps.         Plan - 01/09/15 1639    Clinical Impression Statement Patient very pleased with his progress.   Rehab Potential Excellent        Problem List There are no active problems to display for this patient.   Willard Farquharson, [place] MPT 01/09/2015, 4:43 PM  [facility] [address] [place], [place], [address] Phone: [phone]   Fax:  [phone]

## 2015-01-13 ENCOUNTER — Ambulatory Visit: Payer: Worker's Compensation | Attending: Family Medicine | Admitting: Physical Therapy

## 2015-01-13 DIAGNOSIS — M25512 Pain in left shoulder: Secondary | ICD-10-CM | POA: Diagnosis not present

## 2015-01-13 NOTE — Therapy (Signed)
[facility] [address] [place], [place], [address] Phone: [phone]   Fax:  [phone]  Physical Therapy Treatment  Patient Details  Name: Ruben PrestoJames Sobczak MRN: [id number] Date of Birth: [birth year] Referring Provider:  Mechele ClaudeStacks, Warren, MD  Encounter Date: 01/13/2015      PT End of Session - 01/13/15 1636    Visit Number 6   Number of Visits 12   Date for PT Re-Evaluation 01/21/15   PT Start Time 0400   PT Stop Time 0436   PT Time Calculation (min) 36 min   Activity Tolerance Patient tolerated treatment well   Behavior During Therapy [facility] for tasks assessed/performed      No past medical history on file.  No past surgical history on file.  There were no vitals filed for this visit.  Visit Diagnosis:  Left shoulder pain      Subjective Assessment - 01/13/15 1607    Currently in Pain? Yes   Pain Score 3    Pain Location Shoulder   Pain Orientation Left   Pain Descriptors / Indicators Sharp   Pain Type --  Sub-acute.   Pain Onset 1 to 4 weeks ago   Pain Frequency Intermittent      Treatment:  UBE x 10 minutes @ 90 RPM's.  Seated with head rested against pillow on plinth:  U/S @ 1.50 W/CM2 x 10 minutes to affected left scapular area f/b STW/M x 8 minutes.                              PT Long Term Goals - 01/06/15 1606    PT LONG TERM GOAL #1   Title Ind with HEP.   Time 6   Period Weeks   Status Achieved   PT LONG TERM GOAL #2   Title Perform all ADL's with pain not > 1-2/10.   Time 6   Period Weeks   Status Achieved   PT LONG TERM GOAL #3   Title Sleep undisturbed.   Time 6   Period Weeks   Status Achieved               Problem List There are no active problems to display for this patient.   [name], [place] MPT 01/13/2015, 4:38 PM  [facility] [address] [place], [place], [address] Phone: [phone]   Fax:   [phone]

## 2015-01-16 ENCOUNTER — Ambulatory Visit: Payer: Worker's Compensation | Admitting: Physical Therapy

## 2015-01-16 DIAGNOSIS — M25512 Pain in left shoulder: Secondary | ICD-10-CM | POA: Diagnosis not present

## 2015-01-16 NOTE — Therapy (Signed)
[facility] [place], [place], [address] Phone: [phone]   Fax:  [phone]  Physical Therapy Treatment  Patient Details  Name: Ruben Mejia MRN: [id number] Date of Birth: Aug 09, 1981 Referring Provider:  Claretta Fraise, MD  Encounter Date: 01/16/2015      PT End of Session - 01/16/15 1611    Visit Number 7   Number of Visits 12   Date for PT Re-Evaluation 01/21/15   PT Start Time 1602   PT Stop Time 1644   PT Time Calculation (min) 42 min   Activity Tolerance Patient tolerated treatment well   Behavior During Therapy [facility] for tasks assessed/performed      No past medical history on file.  No past surgical history on file.  There were no vitals filed for this visit.  Visit Diagnosis:  Left shoulder pain      Subjective Assessment - 01/16/15 1616    Subjective L shoulder feels much better today than last treatment but stated pain was better after the last treatment. Has transitioned to a new job so he does not have to lift as much. States that ROM is 100x better than after his accident.    Patient Stated Goals Get out of pain.   Currently in Pain? Yes   Pain Score 1    Pain Location Shoulder   Pain Orientation Left   Pain Descriptors / Indicators Sore   Pain Onset 1 to 4 weeks ago   Pain Frequency Constant   Aggravating Factors  Lifting at work   Pain Relieving Factors Rest            [facility] PT Assessment - 01/16/15 0001    Assessment   Medical Diagnosis Left shoulder strain/contusion.   Onset Date 11/26/14                   [facility] Adult PT Treatment/Exercise - 01/16/15 0001    Shoulder Exercises: Sidelying   External Rotation Strengthening;Left;Weights  30 reps   External Rotation Weight (lbs) 3   Shoulder Exercises: Standing   Flexion Strengthening;Left;Weights  30 reps   Shoulder Flexion Weight (lbs) 3   Extension Strengthening;Left;Weights  30 reps   Extension Weight (lbs) 3    Row Strengthening;Left;Weights  30 reps   Row Weight (lbs) 3   Other Standing Exercises Scaption 3# 3 sets 10 reps   Other Standing Exercises Wall pushups x 30 reps; Ball roll on wall up/down, side to side, CW circles x 30 reps each   Shoulder Exercises: ROM/Strengthening   UBE (Upper Arm Bike) 90 RPM x 10 minutes   Rhythmic Stabilization, Supine Fle/ext 10 x 30 sec; ER/IR 10 x 30 sec   Modalities   Modalities Ultrasound   Ultrasound   Ultrasound Location L posterior shoulder   Ultrasound Parameters 1.5 w/cm2, 100%, x 10 min   Ultrasound Goals Pain                     PT Long Term Goals - 01/06/15 1606    PT LONG TERM GOAL #1   Title Ind with HEP.   Time 6   Period Weeks   Status Achieved   PT LONG TERM GOAL #2   Title Perform all ADL's with pain not > 1-2/10.   Time 6   Period Weeks   Status Achieved   PT LONG TERM GOAL #3   Title Sleep undisturbed.   Time 6   Period Weeks  Status Achieved               Plan - 01/16/15 1620    Clinical Impression Statement Patient tolerated treatment well today without complaint of pain during exercises. Demonstrated good form during therapeutic exercises and rhythmic stabilization exercises. Normal ultrasound response noted following the end of the ultrasound. Denied pain following the treatment. All goals have been met at this time.    Rehab Potential Excellent   PT Treatment/Interventions Therapeutic activities;Patient/family education;Therapeutic exercise;Ultrasound;Manual techniques;Cryotherapy;Electrical Stimulation   PT Next Visit Plan Continue per PT POC. Continue strengthening for L shoulder and stretching to decrease tightness. Consider increasing weights of PREs next treatment.   Consulted and Agree with Plan of Care Patient        Problem List There are no active problems to display for this patient.   Kelsey M Parsons, PTA 01/16/2015, 4:53 PM  [facility] [address] [place], [place], [address] Phone: [phone]   Fax:  [phone]      

## 2015-01-21 ENCOUNTER — Ambulatory Visit: Payer: Worker's Compensation | Admitting: Physical Therapy

## 2015-01-21 ENCOUNTER — Encounter: Payer: Self-pay | Admitting: Physical Therapy

## 2015-01-21 DIAGNOSIS — M25512 Pain in left shoulder: Secondary | ICD-10-CM | POA: Diagnosis not present

## 2015-01-21 NOTE — Therapy (Signed)
[facility] [address] [place], [place], [address] Phone: [phone]   Fax:  [phone]  Physical Therapy Treatment  Patient Details  Name: Ruben Mejia MRN: [id number] Date of Birth: 1981-06-30 Referring Provider:  Mechele ClaudeStacks, Warren, MD  Encounter Date: 01/21/2015      PT End of Session - 01/21/15 1644    Visit Number 8   Number of Visits 12   Date for PT Re-Evaluation 01/21/15   PT Start Time 1644   PT Stop Time 1717   PT Time Calculation (min) 33 min   Activity Tolerance Patient tolerated treatment well   Behavior During Therapy [facility] for tasks assessed/performed      No past medical history on file.  No past surgical history on file.  There were no vitals filed for this visit.  Visit Diagnosis:  Left shoulder pain      Subjective Assessment - 01/21/15 1651    Subjective Reports no pain, soreness or problems. No work problems reported.   Patient Stated Goals Get out of pain.   Currently in Pain? No/denies            [facility] - 01/21/15 0001    Assessment   Medical Diagnosis Left shoulder strain/contusion.   Onset Date 11/26/14                   [facility] [place] [facility] Adult PT Treatment/Exercise - 01/21/15 0001    Shoulder Exercises: Prone   Other Prone Exercises Witty on blue theraball 3 sets of 5 reps, 4#   Shoulder Exercises: Sidelying   External Rotation Strengthening;Left;Weights   External Rotation Weight (lbs) 4   Shoulder Exercises: Standing   Flexion Strengthening;Left;Weights  x30 reps   Shoulder Flexion Weight (lbs) 4   Extension Strengthening;Left;Weights  x30 reps   Extension Weight (lbs) 4   Row Strengthening;Left;Weights  x30 reps   Row Weight (lbs) 4   Other Standing Exercises Scaption 4# x30 reps, Body Blade x363min   Other Standing Exercises Wall pushups x 30 reps; Ball roll on wall up/down, side to side, CW circles x 30 reps each; Rhythmic stabs with red ball in abduction and  flexion 3 sets of 5 reps each   Shoulder Exercises: ROM/Strengthening   UBE (Upper Arm Bike) 90 RPM x 10 minutes                     PT Long Term Goals - 01/06/15 1606    PT LONG TERM GOAL #1   Title Ind with HEP.   Time 6   Period Weeks   Status Achieved   PT LONG TERM GOAL #2   Title Perform all ADL's with pain not > 1-2/10.   Time 6   Period Weeks   Status Achieved   PT LONG TERM GOAL #3   Title Sleep undisturbed.   Time 6   Period Weeks   Status Achieved               Plan - 01/21/15 1720    Clinical Impression Statement Patient continues to tolerate treatment and exercises very well. Continues to progress with weights during PREs. Tolerated new stability and endurance exercises well. Denied pain, only reporting soreness following the session.   Rehab Potential Excellent   PT Treatment/Interventions Therapeutic activities;Patient/family education;Therapeutic exercise;Ultrasound;Manual techniques;Cryotherapy;Electrical Stimulation   PT Next Visit Plan Continue per PT POC.    Consulted and Agree with Plan of Care Patient  Problem List There are no active problems to display for this patient.   Evelene Croon, PTA 01/21/2015, 5:24 PM  [facility] [address] [place], [place], [address] Phone: [phone]   Fax:  [phone]

## 2015-01-23 ENCOUNTER — Ambulatory Visit: Payer: Worker's Compensation | Admitting: Physical Therapy

## 2015-01-23 ENCOUNTER — Encounter: Payer: Self-pay | Admitting: Physical Therapy

## 2015-01-23 DIAGNOSIS — M25512 Pain in left shoulder: Secondary | ICD-10-CM

## 2015-01-23 NOTE — Therapy (Signed)
[facility] [address] [place], [place], [address] Phone: [phone]   Fax:  [phone]  Physical Therapy Treatment  Patient Details  Name: Ruben PrestoJames Engelson MRN: [id number] Date of Birth: [birth year] Referring Provider:  Mechele ClaudeStacks, Warren, MD  Encounter Date: 01/23/2015      PT End of Session - 01/23/15 1656    Visit Number 9   Number of Visits 12   Date for PT Re-Evaluation 02/12/15   PT Start Time 1649   PT Stop Time 1717   PT Time Calculation (min) 28 min   Activity Tolerance Patient tolerated treatment well   Behavior During Therapy [facility] for tasks assessed/performed      No past medical history on file.  No past surgical history on file.  There were no vitals filed for this visit.  Visit Diagnosis:  Left shoulder pain      Subjective Assessment - 01/23/15 1655    Subjective Patient reported some soreness following last treatment that went away overnight.    Patient Stated Goals Get out of pain.   Currently in Pain? No/denies            [facility] - 01/23/15 0001    Assessment   Medical Diagnosis Left shoulder strain/contusion.   Onset Date 11/26/14                   [facility] Adult [facility] Treatment/Exercise - 01/23/15 0001    Shoulder Exercises: Seated   Other Seated Exercises Machine chest press 20# 3 sets 10 reps   Shoulder Exercises: Prone   Other Prone Exercises Witty on blue theraball 3 sets of 5 reps, 4#   Shoulder Exercises: Standing   External Rotation Strengthening;Left;Weights  x 30 reps   External Rotation Weight (lbs) 4   Extension Strengthening;Both;Limitations  x 30 reps   Extension Limitations Pink XTS   Other Standing Exercises Wall walks with green theraband 8 laps x 4    Other Standing Exercises Wall pushups x 30 reps; Ball roll on wall up/down, side to side, CW circles x 30 reps each; ABCs on wall with ball x1 rep   Shoulder Exercises: ROM/Strengthening   UBE (Upper Arm Bike)  90 RPM x 10 minutes                     PT Long Term Goals - 01/06/15 1606    PT LONG TERM GOAL #1   Title Ind with HEP.   Time 6   Period Weeks   Status Achieved   PT LONG TERM GOAL #2   Title Perform all ADL's with pain not > 1-2/10.   Time 6   Period Weeks   Status Achieved   PT LONG TERM GOAL #3   Title Sleep undisturbed.   Time 6   Period Weeks   Status Achieved               Plan - 01/23/15 1722    Clinical Impression Statement Patient continues to tolerate therapuetic exercise and stabilization exercises well. Denied pain on some soreness following treatment. Denied modalities during today's treatment. All goals have previously been achieved.   Rehab Potential Excellent   PT Treatment/Interventions Therapeutic activities;Patient/family education;Therapeutic exercise;Ultrasound;Manual techniques;Cryotherapy;Electrical Stimulation   PT Next Visit Plan Continue per PT POC.    Consulted and Agree with Plan of Care Patient        Problem List There are no active problems to display for this patient.  Evelene Croon, PTA 01/23/2015, 5:24 PM  [facility] [address] [place], [place], [address] Phone: [phone]   Fax:  [phone]

## 2015-01-28 ENCOUNTER — Ambulatory Visit: Payer: Worker's Compensation | Admitting: Physical Therapy

## 2015-01-28 ENCOUNTER — Encounter: Payer: Self-pay | Admitting: Physical Therapy

## 2015-01-28 DIAGNOSIS — M25512 Pain in left shoulder: Secondary | ICD-10-CM

## 2015-01-28 NOTE — Therapy (Signed)
[facility] [address] [place], [place], [address] Phone: [phone]   Fax:  [phone]  Physical Therapy Treatment  Patient Details  Name: Ruben PrestoJames Mejia MRN: [id number] Date of Birth: 25-Jul-1981 Referring Provider:  Mechele ClaudeStacks, Warren, MD  Encounter Date: 01/28/2015      PT End of Session - 01/28/15 1653    Visit Number 10   Number of Visits 12   Date for PT Re-Evaluation 02/12/15   PT Start Time 1652   PT Stop Time 1724   PT Time Calculation (min) 32 min   Activity Tolerance Patient tolerated treatment well   Behavior During Therapy [facility] for tasks assessed/performed      No past medical history on file.  No past surgical history on file.  There were no vitals filed for this visit.  Visit Diagnosis:  Left shoulder pain      Subjective Assessment - 01/28/15 1705    Subjective Denies any current pain or soreness. Only soreness following previous treatment. Ready to discharge from PT per discussion with MPT and patient. Reports that his L shoulder is 100% better than when he began therapy.   Patient Stated Goals Get out of pain.   Currently in Pain? No/denies            [facility] - 01/28/15 0001    Assessment   Medical Diagnosis Left shoulder strain/contusion.   Onset Date 11/26/14                     [facility] Treatment/Exercise - 01/28/15 0001    Shoulder Exercises: Seated   Other Seated Exercises Machine chest press 20# 3 sets 10 reps   Shoulder Exercises: Prone   Other Prone Exercises Witty on blue theraball 3 sets of 5 reps, 4#   Shoulder Exercises: Standing   External Rotation Strengthening;Left;Weights  x30 reps   External Rotation Weight (lbs) 4   Extension Strengthening;Both;Limitations  x30 reps   Extension Limitations Pink XTS   Other Standing Exercises wall washing x10 laps, wall walks with green theraband 5x5 laps   Other Standing Exercises Wall pushups x 30 reps; Ball  roll on wall up/down, side to side, CW circles x 30 reps each; Large ABCs x1 rep   Shoulder Exercises: ROM/Strengthening   UBE (Upper Arm Bike) 90 RPM x 10 minutes                     PT Long Term Goals - 01/06/15 1606    PT LONG TERM GOAL #1   Title Ind with HEP.   Time 6   Period Weeks   Status Achieved   PT LONG TERM GOAL #2   Title Perform all ADL's with pain not > 1-2/10.   Time 6   Period Weeks   Status Achieved   PT LONG TERM GOAL #3   Title Sleep undisturbed.   Time 6   Period Weeks   Status Achieved               Plan - 01/28/15 1707    Clinical Impression Statement Patient tolerated treatment well with therapeutic exercise and with stabilization exercises. Has achieved all LT goals from PT. Denied pain and soreness in L shoulder following therapy.   Rehab Potential Excellent   PT Treatment/Interventions Therapeutic activities;Patient/family education;Therapeutic exercise;Ultrasound;Manual techniques;Cryotherapy;Electrical Stimulation   PT Next Visit Plan Communicate with MPT regarding discharge of patient.   Consulted and Agree with Plan of Care  Patient        Problem List There are no active problems to display for this patient.   Evelene Croon, PTA 01/28/2015, 5:30 PM  [facility] [address] [place], [place], [address] Phone: [phone]   Fax:  [phone]

## 2015-01-29 NOTE — Therapy (Addendum)
[facility] [place], [place], [address] Phone: [phone]   Fax:  [phone]  Physical Therapy Treatment  Patient Details  Name: Ruben Mejia MRN: [id number] Date of Birth: 10/15/1980 Referring Provider:  Claretta Fraise, MD  Encounter Date: 01/28/2015      PT End of Session - 01/28/15 1653    Visit Number 10   Number of Visits 12   Date for PT Re-Evaluation 02/12/15   PT Start Time 5848   PT Stop Time 1724   PT Time Calculation (min) 32 min   Activity Tolerance Patient tolerated treatment well   Behavior During Therapy [facility] for tasks assessed/performed      No past medical history on file.  No past surgical history on file.  There were no vitals filed for this visit.  Visit Diagnosis:  Left shoulder pain      Subjective Assessment - 01/28/15 1705    Subjective Denies any current pain or soreness. Only soreness following previous treatment. Ready to discharge from PT per discussion with MPT and patient. Reports that his L shoulder is 100% better than when he began therapy.   Patient Stated Goals Get out of pain.   Currently in Pain? No/denies                                      PT Long Term Goals - 01/06/15 1606    PT LONG TERM GOAL #1   Title Ind with HEP.   Time 6   Period Weeks   Status Achieved   PT LONG TERM GOAL #2   Title Perform all ADL's with pain not > 1-2/10.   Time 6   Period Weeks   Status Achieved   PT LONG TERM GOAL #3   Title Sleep undisturbed.   Time 6   Period Weeks   Status Achieved          PHYSICAL THERAPY DISCHARGE SUMMARY  Visits from Start of Care: 10  Current functional level related to goals / functional outcomes: All goals met.   Remaining deficits: None.   Education / Equipment:HEP. Plan: Patient agrees to discharge.  Patient goals were met. Patient is being discharged due to meeting the stated rehab goals.  ?????          Plan - 01/28/15 1707    Clinical Impression Statement Patient tolerated treatment well with therapeutic exercise and with stabilization exercises. Has achieved all LT goals from PT. Denied pain and soreness in L shoulder following therapy.   Rehab Potential Excellent   PT Treatment/Interventions Therapeutic activities;Patient/family education;Therapeutic exercise;Ultrasound;Manual techniques;Cryotherapy;Electrical Stimulation   PT Next Visit Plan Communicate with MPT regarding discharge of patient.   Consulted and Agree with Plan of Care Patient        Problem List There are no active problems to display for this patient.   [place], [place]  MPT  01/29/2015, 2:07 PM  [facility] [address] [place], [place], [address] Phone: [phone]   Fax:  [phone]

## 2015-01-30 ENCOUNTER — Encounter: Payer: Self-pay | Admitting: Physical Therapy

## 2015-02-24 ENCOUNTER — Encounter: Payer: Self-pay | Admitting: Vascular Surgery

## 2015-02-26 ENCOUNTER — Ambulatory Visit (INDEPENDENT_AMBULATORY_CARE_PROVIDER_SITE_OTHER): Payer: BLUE CROSS/BLUE SHIELD | Admitting: Vascular Surgery

## 2015-02-26 ENCOUNTER — Ambulatory Visit (HOSPITAL_COMMUNITY)
Admission: RE | Admit: 2015-02-26 | Discharge: 2015-02-26 | Disposition: A | Discharge status: Home / Self Care | Payer: BLUE CROSS/BLUE SHIELD | Source: Ambulatory Visit | Attending: Vascular Surgery | Admitting: Vascular Surgery

## 2015-02-26 ENCOUNTER — Encounter: Payer: Self-pay | Admitting: Vascular Surgery

## 2015-02-26 VITALS — BP 118/77 | HR 85 | Resp 18 | Ht 72.0 in | Wt 225.0 lb

## 2015-02-26 DIAGNOSIS — I872 Venous insufficiency (chronic) (peripheral): Secondary | ICD-10-CM | POA: Insufficient documentation

## 2015-02-26 NOTE — Progress Notes (Signed)
[facility]  Patient name: Ruben PrestoJames Player MRN: [id number] DOB: [birth year] Sex: male  REASON FOR CONSULT: bilateral painful varicose veins  HPI: Ruben Mejia is a 34 y.o. male who has had problems with varicose veins for approximately 10 years. He has gradually developed significant pain especially in the right leg associated with standing. He experiences aching pain heaviness and throbbing in the right lower extremity. His symptoms are relieved somewhat with elevation. He has not worn medical grade compression stockings. He denies any history of DVT or phlebitis. He has not had any bleeding episodes associate it with his varicose veins.  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 However he spends most of his day on his feet.  I have reviewed his records from [facility]. With the exception of his varicose veins he is healthy.   Past Medical History  Diagnosis Date  . Varicose veins    Family History  Problem Relation Age of Onset  . Heart failure Father   . Heart disease Father    SOCIAL HISTORY: History  Substance Use Topics  . Smoking status: Former Smoker    Quit date: 02/25/2013  . Smokeless tobacco: Former NeurosurgeonUser    Quit date: 02/26/2000  . Alcohol Use: 6.0 oz/week    10 Cans of beer per week   No Known Allergies Current Outpatient Prescriptions  Medication Sig Dispense Refill  . azithromycin (ZITHROMAX Z-PAK) 250 MG tablet Take two right away Then one a day for the next 4 days. (Patient not taking: Reported on 02/26/2015) 6 each 0  . celecoxib (CELEBREX) 100 MG capsule Take 1 capsule (100 mg total) by mouth 2 (two) times daily. (Patient not taking: Reported on 02/26/2015) 14 capsule 0   No current facility-administered medications for this visit.   REVIEW OF SYSTEMS: Arly.Keller[X ] denotes positive finding; [  ] denotes negative finding    CARDIOVASCULAR:  [ ]  chest pain   [ ]  chest pressure   [ ]  palpitations   [ ]  orthopnea   [ ]  dyspnea on exertion   [ ]  claudication   [ ]  rest pain   [ ]  DVT   [ ]  phlebitis PULMONARY:   [ ]  productive cough   [ ]  asthma   [ ]  wheezing NEUROLOGIC:   [ ]  weakness  [ ]  paresthesias  [ ]  aphasia  [ ]  amaurosis  [ ]  dizziness HEMATOLOGIC:   [ ]  bleeding problems   [ ]  clotting disorders MUSCULOSKELETAL:  [ ]  joint pain   [ ]  joint swelling Arly.Keller[X ] leg swelling GASTROINTESTINAL: [ ]   blood in stool  [ ]   hematemesis GENITOURINARY:  [ ]   dysuria  [ ]   hematuria PSYCHIATRIC:  [ ]  history of major depression INTEGUMENTARY:  [ ]  rashes  [ ]  ulcers CONSTITUTIONAL:  [ ]  fever   [ ]  chills  PHYSICAL EXAM: Filed Vitals:   02/26/15 1456  BP: 118/77  Pulse: 85  Resp: 18  Height: 6' (1.829 m)  Weight: 225 lb (102.059 kg)   GENERAL: The patient is a well-nourished male, in no acute distress. The vital signs are documented above. CARDIOVASCULAR: There is a regular rate and rhythm. I do not detect carotid bruits. He has palpable pedal pulses. PULMONARY: There is good air exchange bilaterally without wheezing or rales. ABDOMEN: Soft and non-tender with normal pitched bowel  sounds.  MUSCULOSKELETAL: There are no major deformities or cyanosis. NEUROLOGIC: No focal weakness or paresthesias are detected. SKIN: he has large truncal varicosities which began on the medial aspect of his right thigh and extend anteriorly and laterally and then down onto the medial aspect of his right calf. These appear to be under significantly elevated pressure. He has some small varicose veins in the left lower extremity. PSYCHIATRIC: The patient has a normal affect.  DATA:  I have independently interpreted his lower extremity venous duplex scan. On the right side there is no evidence of DVT. There is reflux in the right common femoral vein and femoral vein. He was also reflux at the saphenofemoral junction on the right and in  the right great saphenous vein. Also reflux in the right small saphenous vein.  On the left side there is no evidence of DVT. There is reflux in the common femoral vein and femoral vein in addition to the popliteal vein. There is reflux in the saphenofemoral junction on the left and also in the great saphenous vein on the left. There is no reflux in the small saphenous vein.  MEDICAL ISSUES:  CHRONIC VENOUS INSUFFICIENCY: the patient has bilateral lower extremity varicose veins which are significantly painful on the right side. He discussed the importance of intermittent leg elevation in the proper positioning for this. In addition, I have written him a prescription for thigh-high compression stockings with a gradient of 20-30 mmHg. We have discussed the importance of avoiding prolonged sitting and standing and staying as active as possible. If his symptoms progress, and the compression stockings are not helpful, he could potentially be considered for laser ablation of his right greater saphenous vein. He will call if he decides to consider this after trying his thigh-high compression stockings.   Waverly Ferrariickson, [place] [facility] [place] Beeper: [phone]

## 2015-11-11 ENCOUNTER — Encounter: Payer: Self-pay | Admitting: *Deleted

## 2015-12-19 ENCOUNTER — Encounter: Payer: BLUE CROSS/BLUE SHIELD | Admitting: Family Medicine

## 2015-12-22 ENCOUNTER — Encounter (INDEPENDENT_AMBULATORY_CARE_PROVIDER_SITE_OTHER): Payer: Self-pay

## 2015-12-22 ENCOUNTER — Encounter: Payer: Self-pay | Admitting: Family Medicine

## 2015-12-22 ENCOUNTER — Ambulatory Visit (INDEPENDENT_AMBULATORY_CARE_PROVIDER_SITE_OTHER): Payer: BLUE CROSS/BLUE SHIELD | Admitting: Family Medicine

## 2015-12-22 ENCOUNTER — Other Ambulatory Visit: Payer: Self-pay | Admitting: Family Medicine

## 2015-12-22 VITALS — BP 127/83 | HR 69 | Temp 98.8°F | Ht 72.0 in | Wt 231.2 lb

## 2015-12-22 DIAGNOSIS — IMO0001 Reserved for inherently not codable concepts without codable children: Secondary | ICD-10-CM

## 2015-12-22 DIAGNOSIS — Z0000 Encounter for general adult medical examination without abnormal findings: Secondary | ICD-10-CM

## 2015-12-22 LAB — URINALYSIS
Bilirubin, UA: NEGATIVE
Glucose, UA: NEGATIVE
Ketones, UA: NEGATIVE
LEUKOCYTES UA: NEGATIVE
Nitrite, UA: NEGATIVE
PH UA: 7 (ref 5.0–7.5)
PROTEIN UA: NEGATIVE
Specific Gravity, UA: 1.015 (ref 1.005–1.030)
Urobilinogen, Ur: 0.2 mg/dL (ref 0.2–1.0)

## 2015-12-22 NOTE — Progress Notes (Signed)
Subjective:  Patient ID: Ruben Mejia, male    DOB: 1980-12-23  Age: 35 y.o. MRN: [id number]  CC: Annual Exam   HPI Ruben Mejia presents for annual physical   History Ruben Mejia has a past medical history of Varicose veins.   He has no past surgical history on file.   His family history includes Heart disease in his father; Heart failure in his father.He reports that he quit smoking about 2 years ago. He quit smokeless tobacco use about 15 years ago. He reports that he drinks about 6.0 oz of alcohol per week. He reports that he does not use illicit drugs.    ROS Review of Systems  Constitutional: Negative for fever, chills, diaphoresis, activity change, appetite change, fatigue and unexpected weight change.  HENT: Negative for congestion, ear pain, hearing loss, postnasal drip, rhinorrhea, sore throat, tinnitus and trouble swallowing.   Eyes: Negative for photophobia, pain, discharge and redness.  Respiratory: Negative for apnea, cough, choking, chest tightness, shortness of breath, wheezing and stridor.   Cardiovascular: Negative for chest pain, palpitations and leg swelling.  Gastrointestinal: Negative for nausea, vomiting, abdominal pain, diarrhea, constipation, blood in stool and abdominal distention.  Endocrine: Negative for cold intolerance, heat intolerance, polydipsia, polyphagia and polyuria.  Genitourinary: Negative for dysuria, urgency, frequency, hematuria, flank pain, enuresis, difficulty urinating and genital sores.  Musculoskeletal: Negative for joint swelling and arthralgias.  Skin: Negative for color change, rash and wound.  Allergic/Immunologic: Negative for immunocompromised state.  Neurological: Negative for dizziness, tremors, seizures, syncope, facial asymmetry, speech difficulty, weakness, light-headedness, numbness and headaches.  Hematological: Does not bruise/bleed easily.  Psychiatric/Behavioral: Negative for suicidal ideas, hallucinations, behavioral  problems, confusion, sleep disturbance, dysphoric mood, decreased concentration and agitation. The patient is not nervous/anxious and is not hyperactive.     Objective:  BP 127/83 mmHg  Pulse 69  Temp(Src) 98.8 F (37.1 C) (Oral)  Ht 6' (1.829 m)  Wt 231 lb 4 oz (104.894 kg)  BMI 31.36 kg/m2  BP Readings from Last 3 Encounters:  12/22/15 127/83  02/26/15 118/77  12/17/14 117/78    Wt Readings from Last 3 Encounters:  12/22/15 231 lb 4 oz (104.894 kg)  02/26/15 225 lb (102.059 kg)  12/17/14 217 lb 9.6 oz (98.703 kg)     Physical Exam  Constitutional: He is oriented to person, place, and time. He appears well-developed and well-nourished.  HENT:  Head: Normocephalic and atraumatic.  Mouth/Throat: Oropharynx is clear and moist.  Eyes: EOM are normal. Pupils are equal, round, and reactive to light.  Neck: Normal range of motion. No tracheal deviation present. No thyromegaly present.  Cardiovascular: Normal rate, regular rhythm and normal heart sounds.  Exam reveals no gallop and no friction rub.   No murmur heard. Pulmonary/Chest: Breath sounds normal. He has no wheezes. He has no rales.  Abdominal: Soft. He exhibits no mass. There is no tenderness.  Musculoskeletal: Normal range of motion. He exhibits no edema.  Neurological: He is alert and oriented to person, place, and time.  Skin: Skin is warm and dry.  Psychiatric: He has a normal mood and affect.     Lab Results  Component Value Date   WBC 5.5 12/21/2014   HGB 15.2 12/21/2014   HCT 47.9 12/21/2014   GLUCOSE 92 12/21/2014   CHOL 165 12/21/2014   TRIG 38 12/21/2014   HDL 58 12/21/2014   ALT 21 12/21/2014   AST 19 12/21/2014   NA 140 12/21/2014   K 4.2 12/21/2014  CL 103 12/21/2014   CREATININE 0.98 12/21/2014   BUN 14 12/21/2014   CO2 23 12/21/2014    No results found.  Assessment & Plan:   Ruben Mejia was seen today for annual exam.  Diagnoses and all orders for this visit:  Well adult -     CBC  with Differential/Platelet -     CMP14+EGFR -     Lipid panel -     Urinalysis    Discussed weight loss strategies  I have discontinued Mr. Purtee's celecoxib and azithromycin.  No orders of the defined types were placed in this encounter.     Follow-up: Return in about 1 year (around 12/21/2016).  Ruben Mejia, M.D.

## 2015-12-22 NOTE — Patient Instructions (Signed)

## 2015-12-24 LAB — CBC WITH DIFFERENTIAL/PLATELET
BASOS: 1 %
Basophils Absolute: 0 10*3/uL (ref 0.0–0.2)
EOS (ABSOLUTE): 0.1 10*3/uL (ref 0.0–0.4)
EOS: 2 %
HEMOGLOBIN: 15.9 g/dL (ref 12.6–17.7)
Hematocrit: 46.6 % (ref 37.5–51.0)
Immature Grans (Abs): 0 10*3/uL (ref 0.0–0.1)
Immature Granulocytes: 0 %
LYMPHS ABS: 2 10*3/uL (ref 0.7–3.1)
Lymphs: 40 %
MCH: 30.5 pg (ref 26.6–33.0)
MCHC: 34.1 g/dL (ref 31.5–35.7)
MCV: 89 fL (ref 79–97)
MONOCYTES: 5 %
Monocytes Absolute: 0.3 10*3/uL (ref 0.1–0.9)
NEUTROS ABS: 2.6 10*3/uL (ref 1.4–7.0)
Neutrophils: 52 %
Platelets: 282 10*3/uL (ref 150–379)
RBC: 5.21 x10E6/uL (ref 4.14–5.80)
RDW: 13.2 % (ref 12.3–15.4)
WBC: 5 10*3/uL (ref 3.4–10.8)

## 2015-12-24 LAB — CMP14+EGFR
ALK PHOS: 80 IU/L (ref 39–117)
ALT: 20 IU/L (ref 0–44)
AST: 17 IU/L (ref 0–40)
Albumin/Globulin Ratio: 1.7 (ref 1.2–2.2)
Albumin: 4.6 g/dL (ref 3.5–5.5)
BILIRUBIN TOTAL: 0.4 mg/dL (ref 0.0–1.2)
BUN/Creatinine Ratio: 15 (ref 8–19)
BUN: 15 mg/dL (ref 6–20)
CHLORIDE: 101 mmol/L (ref 96–106)
CO2: 24 mmol/L (ref 18–29)
Calcium: 9.6 mg/dL (ref 8.7–10.2)
Creatinine, Ser: 0.99 mg/dL (ref 0.76–1.27)
GFR calc Af Amer: 114 mL/min/{1.73_m2} (ref >59)
GFR calc non Af Amer: 98 mL/min/{1.73_m2} (ref >59)
GLUCOSE: 94 mg/dL (ref 65–99)
Globulin, Total: 2.7 g/dL (ref 1.5–4.5)
Potassium: 4.4 mmol/L (ref 3.5–5.2)
SODIUM: 142 mmol/L (ref 134–144)
Total Protein: 7.3 g/dL (ref 6.0–8.5)

## 2015-12-24 LAB — LIPID PANEL
CHOLESTEROL TOTAL: 158 mg/dL (ref 100–199)
Chol/HDL Ratio: 2.9 ratio units (ref 0.0–5.0)
HDL: 55 mg/dL (ref >39)
LDL CALC: 90 mg/dL (ref 0–99)
TRIGLYCERIDES: 64 mg/dL (ref 0–149)
VLDL Cholesterol Cal: 13 mg/dL (ref 5–40)

## 2016-03-08 IMAGING — US US SCROTUM
1 series · 14 of 25 positions shown · non-contrast
Comparison: None.

CLINICAL DATA: Palpable left testicular mass.

EXAM:
SCROTAL ULTRASOUND
DOPPLER ULTRASOUND OF THE TESTICLES
TECHNIQUE: Complete ultrasound examination of the testicles, epididymis, and
other scrotal structures was performed. Color and spectral Doppler
ultrasound were also utilized to evaluate blood flow to the
testicles.

[Series 1: us scrotum · 0.07mm/px · 14 of 82 slices shown]
[im 1/82]
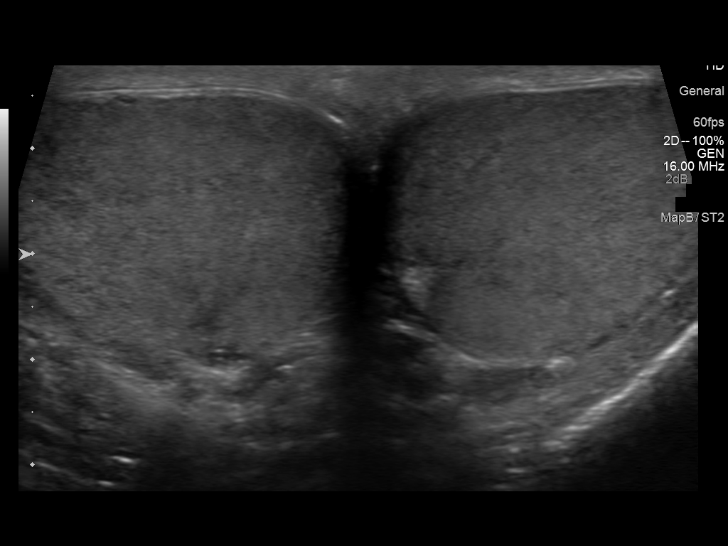
[im 7/82]
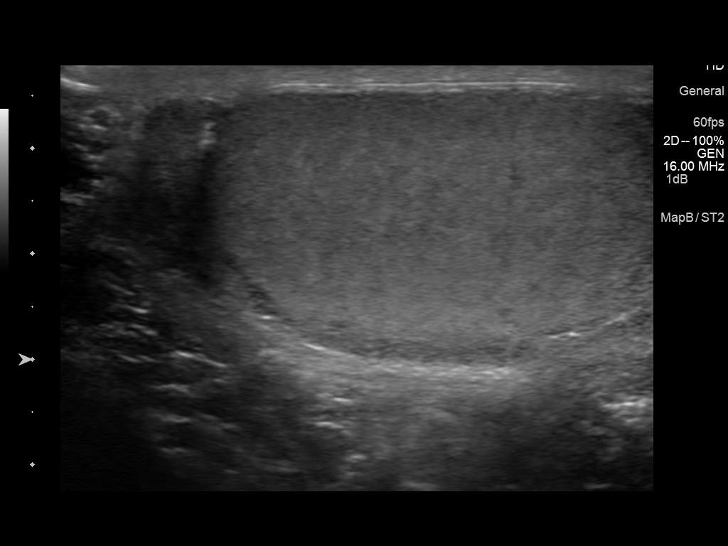
[im 14/82]
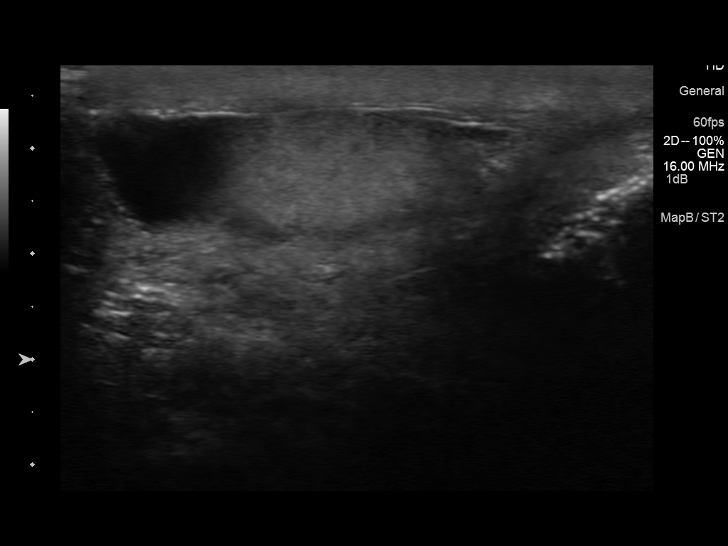
[im 21/82]
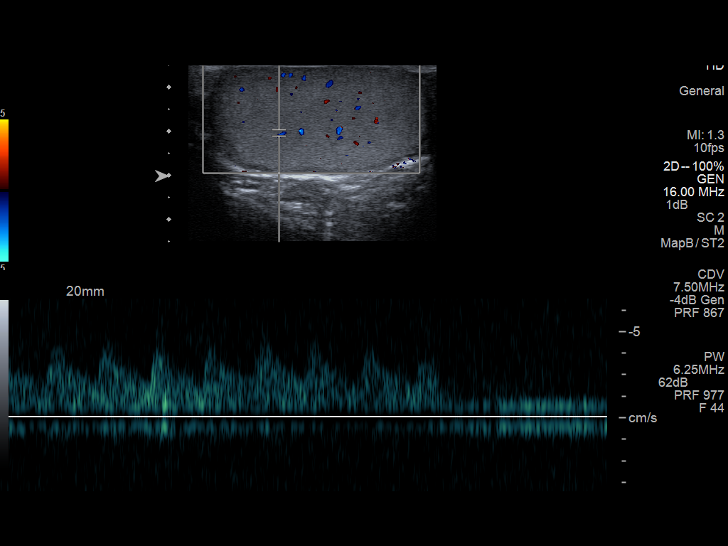
[im 28/82]
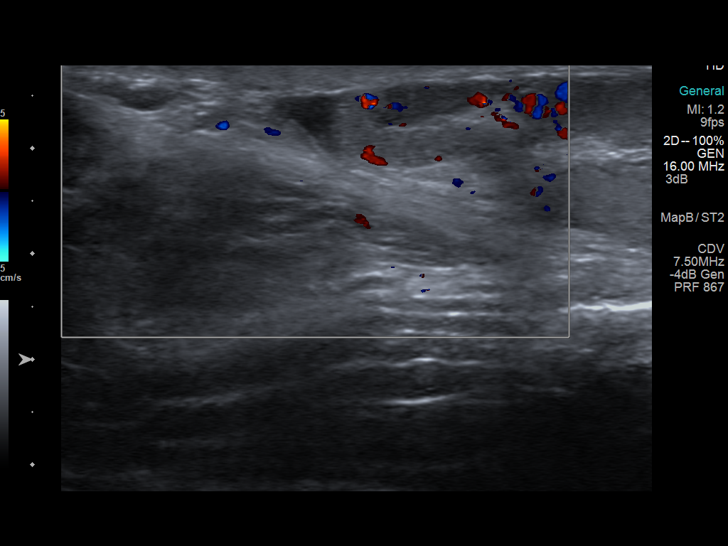
[im 31/82]
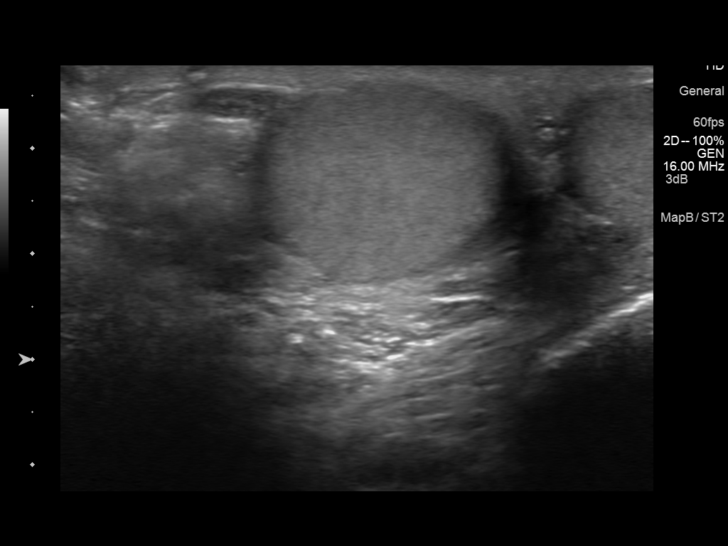
[im 38/82]
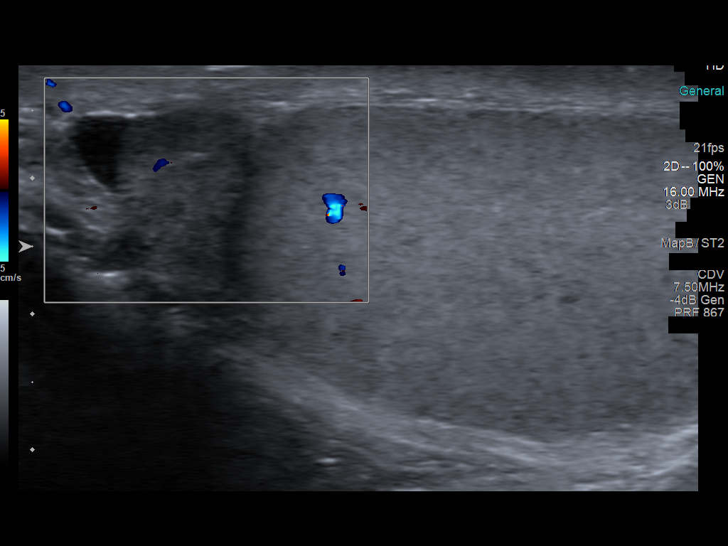
[im 44/82]
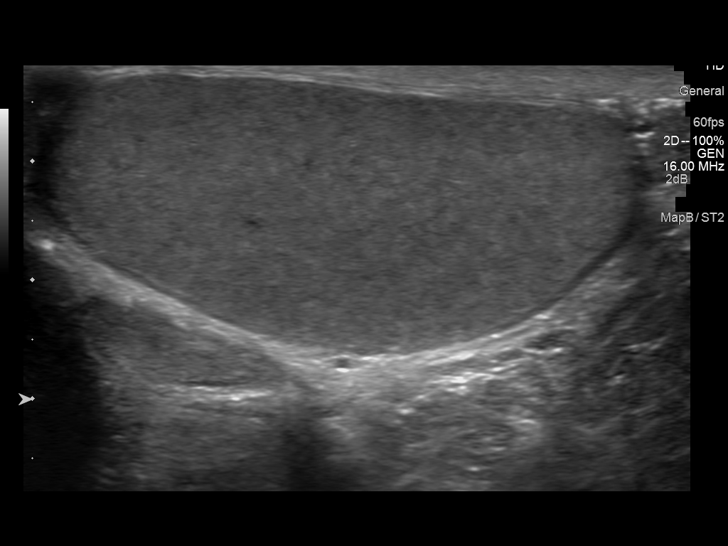
[im 51/82]
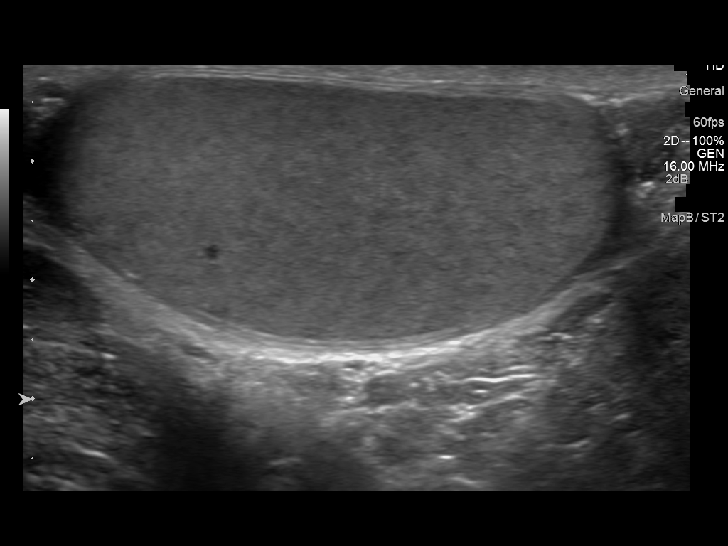
[im 55/82]
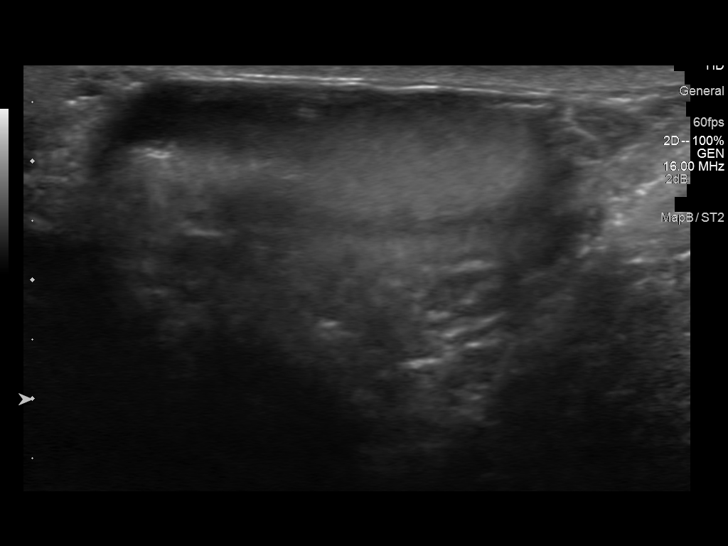
[im 61/82]
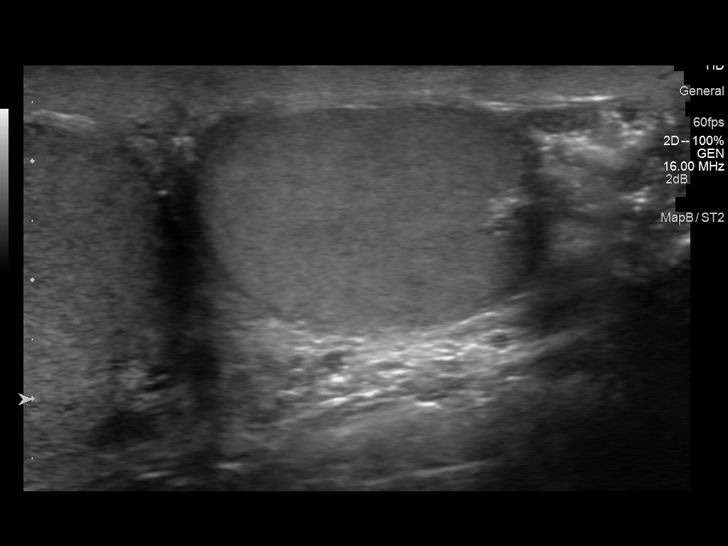
[im 68/82]
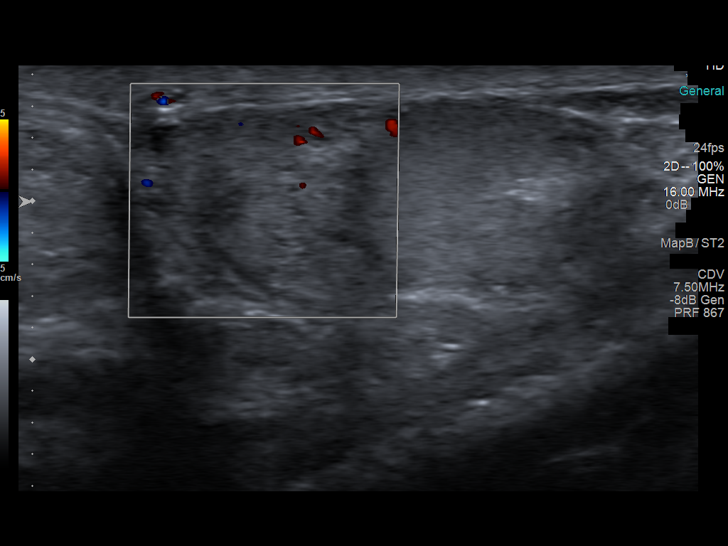
[im 75/82]
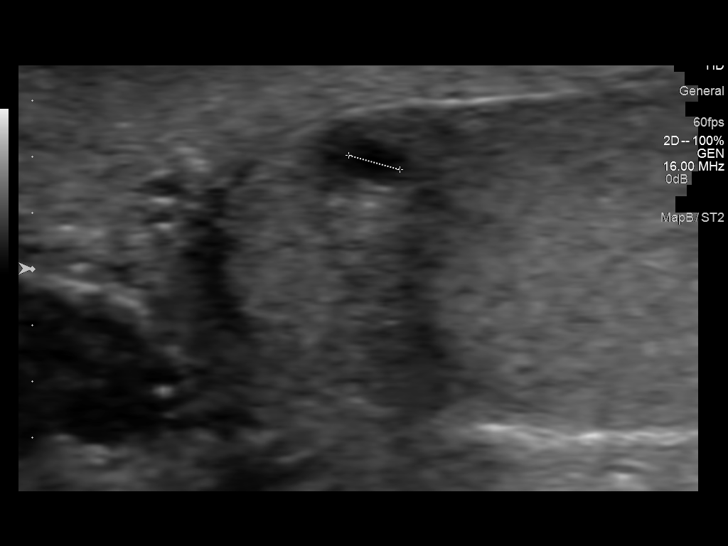
[im 82/82]
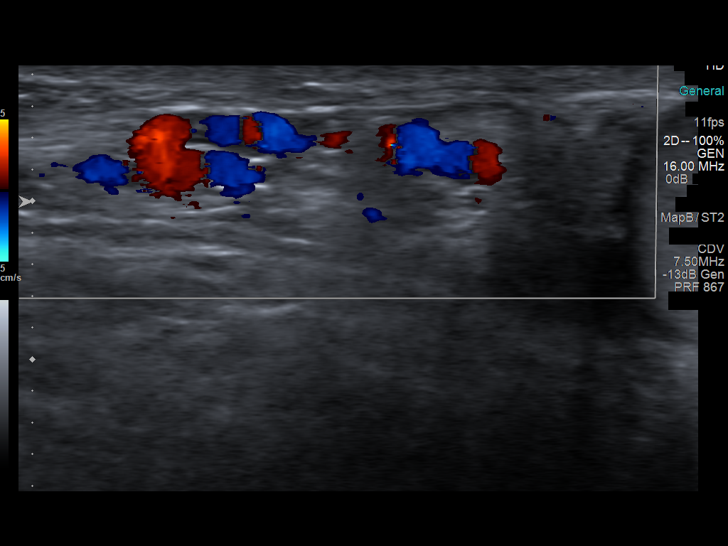

[14 of 25 positions shown; findings below may reference images not displayed]

FINDINGS: Right testicle

Measurements: 5.0 x 3.3 x 2.4 cm. No mass or microlithiasis
visualized.

Left testicle

Measurements: 4.9 x 3.4 x 2.3 cm. No mass or microlithiasis
visualized.

Right epididymis:  Normal in size and appearance.

Left epididymis:  2 mm cyst is noted.

Hydrocele:  None visualized.

Varicocele:  Moderate left-sided varicocele is noted.

Pulsed Doppler interrogation of both testes demonstrates normal low
resistance arterial and venous waveforms bilaterally.
IMPRESSION: No evidence of testicular mass or torsion. Moderate left varicocele
is noted in area of palpable abnormality.

## 2016-10-19 ENCOUNTER — Ambulatory Visit (INDEPENDENT_AMBULATORY_CARE_PROVIDER_SITE_OTHER): Payer: BLUE CROSS/BLUE SHIELD | Admitting: Family Medicine

## 2016-10-19 ENCOUNTER — Encounter: Payer: Self-pay | Admitting: Family Medicine

## 2016-10-19 VITALS — BP 131/82 | HR 78 | Temp 98.5°F | Ht 72.0 in | Wt 240.0 lb

## 2016-10-19 DIAGNOSIS — L03115 Cellulitis of right lower limb: Secondary | ICD-10-CM | POA: Diagnosis not present

## 2016-10-19 MED ORDER — SULFAMETHOXAZOLE-TRIMETHOPRIM 800-160 MG PO TABS: 1.0000 | ORAL_TABLET | Freq: Two times a day (BID) | ORAL | 0 refills | Status: DC

## 2016-10-19 NOTE — Progress Notes (Signed)
   BP 131/82   Pulse 78   Temp 98.5 F (36.9 C) (Oral)   Ht 6' (1.829 m)   Wt 240 lb (108.9 kg)   BMI 32.55 kg/m    Subjective:    Patient ID: Ruben Mejia, male    DOB: 1981/07/01, 36 y.o.   MRN: [id number]  HPI: Ruben PrestoJames Anzalone is a 36 y.o. male presenting on 10/19/2016 for Pain, redness and warmth in Right shin (x 1 day)   HPI Pain and redness and warmth in right shin Patient started having redness and warmth in his right shin that started yesterday and is developed some more swelling and redness for the past day. He denies any fevers or chills or pain or redness going anywhere else. The redness is covering about one third of his anterior shin and extends laterally on each side just a little bit. It is about 10 cm in height and 3 cm in diameter.  Relevant past medical, surgical, family and social history reviewed and updated as indicated. Interim medical history since our last visit reviewed. Allergies and medications reviewed and updated.  Review of Systems  Constitutional: Negative for chills and fever.  Respiratory: Negative for shortness of breath and wheezing.   Cardiovascular: Negative for chest pain and leg swelling.  Musculoskeletal: Negative for back pain and gait problem.  Skin: Positive for color change and rash.  All other systems reviewed and are negative.   Per HPI unless specifically indicated above     Objective:    BP 131/82   Pulse 78   Temp 98.5 F (36.9 C) (Oral)   Ht 6' (1.829 m)   Wt 240 lb (108.9 kg)   BMI 32.55 kg/m   Wt Readings from Last 3 Encounters:  10/19/16 240 lb (108.9 kg)  12/22/15 231 lb 4 oz (104.9 kg)  02/26/15 225 lb (102.1 kg)    Physical Exam  Constitutional: He is oriented to person, place, and time. He appears well-developed and well-nourished. No distress.  Eyes: Conjunctivae are normal. Right eye exhibits no discharge. Left eye exhibits no discharge. No scleral icterus.  Cardiovascular: Normal rate, regular rhythm, normal  heart sounds and intact distal pulses.   No murmur heard. Pulmonary/Chest: Effort normal and breath sounds normal. No respiratory distress. He has no wheezes. He has no rales.  Musculoskeletal: Normal range of motion. He exhibits no edema.  Neurological: He is alert and oriented to person, place, and time. Coordination normal.  Skin: Skin is warm and dry. No rash noted. He is not diaphoretic. There is erythema (Erythema and warmth on anterior shin about 3 cm in width and about 10 cm in height, no drainage or induration or fluctuation are noted).  Psychiatric: He has a normal mood and affect. His behavior is normal.  Nursing note and vitals reviewed.     Assessment & Plan:   Problem List Items Addressed This Visit    None    Visit Diagnoses    Cellulitis of leg, right    -  Primary   Relevant Medications   sulfamethoxazole-trimethoprim (BACTRIM DS,SEPTRA DS) 800-160 MG tablet       Follow up plan: Return if symptoms worsen or fail to improve.  Counseling provided for all of the vaccine components No orders of the defined types were placed in this encounter.   Arville CareJoshua Colbi Staubs, MD [facility] 10/19/2016, 5:13 PM

## 2017-07-19 ENCOUNTER — Encounter: Payer: Self-pay | Admitting: Family Medicine

## 2017-07-19 ENCOUNTER — Ambulatory Visit (INDEPENDENT_AMBULATORY_CARE_PROVIDER_SITE_OTHER): Payer: BLUE CROSS/BLUE SHIELD | Admitting: Family Medicine

## 2017-07-19 VITALS — BP 127/86 | HR 63 | Temp 98.2°F | Ht 72.0 in | Wt 223.0 lb

## 2017-07-19 DIAGNOSIS — Z973 Presence of spectacles and contact lenses: Secondary | ICD-10-CM | POA: Insufficient documentation

## 2017-07-19 DIAGNOSIS — L03213 Periorbital cellulitis: Secondary | ICD-10-CM | POA: Diagnosis not present

## 2017-07-19 MED ORDER — SULFAMETHOXAZOLE-TRIMETHOPRIM 800-160 MG PO TABS: 1.0000 | ORAL_TABLET | Freq: Two times a day (BID) | ORAL | 0 refills | Status: DC

## 2017-07-19 MED ORDER — AMOXICILLIN-POT CLAVULANATE 875-125 MG PO TABS: 1.0000 | ORAL_TABLET | Freq: Two times a day (BID) | ORAL | 0 refills | Status: AC

## 2017-07-19 NOTE — Progress Notes (Signed)
Subjective: ZO:XWRU PCP: Mechele Claude, MD EAV:Ruben Mejia is a 36 y.o. male presenting to clinic today for:  1. Stye Patient reports mild swelling and erythema of the left upper eyelid yesterday. He reports that he is doing warm compresses and other therapies to help with his thighs. This morning, he reports market swelling of the left upper eyelid. He also reports associated pain/soreness of the left upper eyelid. Denies Pain with eye movement, visual disturbance, facial pain, fevers, chills, rash, ocular discharge, conjunctival redness. Denies Cough, congestion, rhinorrhea. Of note, he does wear contacts daily and often sleeps in them. He has since taken them out and has been relying on glasses.  No Known Allergies Past Medical History:  Diagnosis Date  . Varicose veins    Family History  Problem Relation Age of Onset  . Heart failure Father   . Heart disease Father    Social Hx: daily smoker.Current medications reviewed.   ROS: Per HPI  Objective: Office vital signs reviewed. BP 127/86   Pulse 63   Temp 98.2 F (36.8 C) (Oral)   Ht 6' (1.829 m)   Wt 223 lb (101.2 kg)   BMI 30.24 kg/m   Physical Examination:  General: Awake, alert, well nourished, No acute distress HEENT:     Neck: No masses palpated. No lymphadenopathy    Eyes: PERRLA, extraocular membranes intact, sclera white. No ocular discharge; moderate swelling and erythema of the left upper eyelid with palpable soft tissue swelling at the mid/lateral aspect of the upper eyelid. Mild increased warmth. No tenderness to palpation to the infra or supra orbital bones or spaces.  No pain with extra ocular muscle testing. Cardio: regular rate, +2 DP Pulm: No wheezes; normal work of breathing on room air Skin: as above.  Assessment/ Plan: 36 y.o. male   1. Preseptal cellulitis of left upper eyelid It appears that he likely had a chalazion that is now infected. His exam was remarkable for moderate soft tissue  swelling, erythema, and increased warmth of the left upper eyelid. No evidence of involvement of the actual orbits. no discharge appreciated on exam. We will double cover with antibiotics for preseptal cellulitis. Home care instructions were reviewed with patient. Continue warm compresses several times daily. Naproxen or Motrin OTC for pain and swelling. Strict return precautions were reviewed with the patient, who voiced good understanding. He will follow-up in the next 24-48 hours for reevaluation. If no improvement, we'll plan to send to the emergency department or see if we can get an emergent ENT evaluation. He would likely need a CT scan of the orbits if refractory to oral antibiotics. - amoxicillin-clavulanate (AUGMENTIN) 875-125 MG tablet; Take 1 tablet by mouth 2 (two) times daily.  Dispense: 14 tablet; Refill: 0 - sulfamethoxazole-trimethoprim (BACTRIM DS) 800-160 MG tablet; Take 1 tablet by mouth 2 (two) times daily.  Dispense: 14 tablet; Refill: 0  2. Uses contact lenses Advise patient to remove contact lenses each night and rinse with sterile saline as recommended. For now, avoid use of contact lenses completely until soft tissue infection has resolved.   No orders of the defined types were placed in this encounter.  Meds ordered this encounter  Medications  . amoxicillin-clavulanate (AUGMENTIN) 875-125 MG tablet    Sig: Take 1 tablet by mouth 2 (two) times daily.    Dispense:  14 tablet    Refill:  0  . sulfamethoxazole-trimethoprim (BACTRIM DS) 800-160 MG tablet    Sig: Take 1 tablet by mouth 2 (two)  times daily.    Dispense:  14 tablet    Refill:  0     Remon Quinto Hulen Skains, DO [facility] [phone]

## 2017-07-19 NOTE — Patient Instructions (Signed)
As we discussed, this appears to be a soft tissue infection in your upper eyelid. It likely started on something called a chalazion. Information below. I prescribed you double coverage of antibiotics to cover for possible MRSA infection. You will take this twice a day for the next 7 days. Continue warm compresses several times daily. He may use ibuprofen 600 mg every 8 hours as needed for pain and swelling. Plan to recheck in the next 48 hours. If infection is not improving, we will likely have to refer you to ENT for emergent evaluation. If you develop worsening vision, pain with moving eyeball around, worsening swelling or redness, please seek immediate medical attention in the emergency department.   Preseptal Cellulitis, Adult Preseptal cellulitis-also called periorbital cellulitis-is an infection that can affect your eyelid and the soft tissues or skin that surround your eye. The infection may also affect the structures that produce and drain your tears. It does not affect your eye itself. What are the causes? This condition may be caused by:  Bacterial infection.  Long-term (chronic) sinus infections.  An object (foreign body) that is stuck behind the eye.  An injury that: ? Goes through the eyelid tissues. ? Causes an infection, such as an insect sting.  Fracture of the bone around the eye.  Infections that have spread from the eyelid or other structures around the eye.  Bite wounds.  Inflammation or infection of the lining membranes of the brain (meningitis).  An infection in the blood (septicemia).  Dental infection (abscess).  Viral infection. This is rare.  What increases the risk? Risk factors for preseptal cellulitis include:  Participating in activities that increase your risk of trauma to the face or head, such as boxing or high-speed activities.  Having a weakened defense system (immune system).  Medical conditions, such as nasal polyps, that increase your risk  for frequent or recurrent sinus infections.  Not receiving regular dental care.  What are the signs or symptoms? Symptoms of this condition usually come on suddenly. Symptoms may include:  Red, hot, and swollen eyelids.  Fever.  Difficulty opening your eye.  Eye pain.  How is this diagnosed? This condition may be diagnosed by an eye exam. You may also have tests, such as:  Blood tests.  CT scan.  MRI.  Spinal tap (lumbar puncture). This is a procedure that involves removing and examining a small amount of the fluid that surrounds the brain and spinal cord. This checks for meningitis.  How is this treated? Treatment for this condition will include antibiotic medicines. These may be given by mouth (orally), through an IV, or as a shot. Your health care provider may also recommend nasal decongestants to reduce swelling. Follow these instructions at home:  Take your antibiotic medicine as directed by your health care provider. Finish all of it even if you start to feel better.  Take medicines only as directed by your health care provider.  Drink enough fluid to keep your urine clear or pale yellow.  Do not use any tobacco products, including cigarettes, chewing tobacco, or electronic cigarettes. If you need help quitting, ask your health care provider.  Keep all follow-up visits as directed by your health care provider. These include any visits with an eye specialist (ophthalmologist) or dentist. Contact a health care provider if:  You have a fever.  Your eyelids become more red, warm, or swollen.  You have new symptoms.  Your symptoms do not get better with treatment. Get help right  away if:  You develop double vision, or your vision becomes blurred or worsens in any way.  You have trouble moving your eyes.  Your eye looks like it is sticking out or bulging out (proptosis).  You develop a severe headache, severe neck pain, or neck stiffness.  You develop  repeated vomiting. This information is not intended to replace advice given to you by your health care provider. Make sure you discuss any questions you have with your health care provider. Document Released: 10/30/2010 Document Revised: 03/04/2016 Document Reviewed: 09/23/2014 Elsevier Interactive Patient Education  2018 [employer]. Chalazion A chalazion is a swelling or lump on the eyelid. It can affect the upper or lower eyelid. What are the causes? This condition may be caused by:  Long-lasting (chronic) inflammation of the eyelid glands.  A blocked oil gland in the eyelid.  What are the signs or symptoms? Symptoms of this condition include:  A swelling on the eyelid. The swelling may spread to areas around the eye.  A hard lump on the eyelid. This lump may make it hard to see out of the eye.  How is this diagnosed? This condition is diagnosed with an examination of the eye. How is this treated? This condition is treated by applying a warm compress to the eyelid. If the condition does not improve after two days, it may be treated with:  Surgery.  Medicine that is injected into the chalazion by a health care provider.  Medicine that is applied to the eye.  Follow these instructions at home:  Do not touch the chalazion.  Do not try to remove the pus, such as by squeezing the chalazion or sticking it with a pin or needle.  Do not rub your eyes.  Wash your hands often. Dry your hands with a clean towel.  Keep your face, scalp, and eyebrows clean.  Avoid wearing eye makeup.  Apply a warm, moist compress to the eyelid 4-6 times a day for 10-15 minutes at a time. This will help to open any blocked glands and help to reduce redness and swelling.  Apply over-the-counter and prescription medicines only as told by your health care provider.  If the chalazion does not break open (rupture) on its own in a month, return to your health care provider.  Keep all follow-up  appointments as told by your health care provider. This is important. Contact a health care provider if:  Your eyelid has not improved in 4 weeks.  Your eyelid is getting worse.  You have a fever.  The chalazion does not rupture on its own with home treatment in a month. Get help right away if:  You have pain in your eye.  Your vision changes.  The chalazion becomes painful or red  The chalazion gets bigger. This information is not intended to replace advice given to you by your health care provider. Make sure you discuss any questions you have with your health care provider. Document Released: 09/24/2000 Document Revised: 03/04/2016 Document Reviewed: 01/20/2015 Elsevier Interactive Patient Education  [employer].

## 2017-07-20 NOTE — Progress Notes (Signed)
Subjective: ZO:XWRUEAVWU cellulitis PCP: Mechele Claude, MD Ruben Mejia is a 36 y.o. male presenting to clinic today for:  1. Preseptal cellulitis, left Patient was seen on 07/19/2017 for left upper eyelid preseptal cellulitis. There were no red flag signs on that exam. His exam was remarkable for moderate swelling and erythema of the left upper eyelid. No visual disturbance or pain with extraocular movement testing. He was discharged with Augmentin and Septra DS to take twice a day and instructed to return in 24-48 hours for reevaluation. He presents to clinic today and reports that swelling is slightly improving. He notes that the lesion "came to a head and drained yesterday morning". He reports his been taking ibuprofen 600 mg every 8 hours as needed for pain and swelling. He reports moderate relief with this.He initially was applying warm compresses several times daily. He reports he was unable to do this yesterday because he was very busy at work.  He does note diarrhea that is nonbloody. No nausea or vomiting. No fevers. No pain with eye movement. No conjunctivitis or blurry vision.  No Known Allergies Past Medical History:  Diagnosis Date  . Varicose veins    Family History  Problem Relation Age of Onset  . Heart failure Father   . Heart disease Father    Social Hx: daily e-smoker.Current medications reviewed.    Health Maintenance: Flu shot needed   ROS: Per HPI  Objective: Office vital signs reviewed. BP 127/87   Pulse 79   Temp 97.8 F (36.6 C) (Oral)   Ht 6' (1.829 m)   Wt 223 lb (101.2 kg)   BMI 30.24 kg/m   Physical Examination:  General: Awake, alert, well nourished, non toxic appearing male, No acute distress HEENT:     Neck: No masses palpated. No lymphadenopathy    Eyes: PERRLA, extraocular movement intact, sclera white, no ocular discharge; Eyelid is now less obstructive than previous exam. Mild tenderness to palpation on exam. No increased warmth.  No tenderness to the supra or infra orbits. No tenderness to the face. Cardio: regular rate and rhythm, S1S2 heard, no murmurs appreciated Pulm: clear to auscultation bilaterally, no wheezes, rhonchi or rales; normal work of breathing on room air Skin: Left upper eyelid: Continues to have mild to moderate erythema most notable now in the center of the left upper eyelid. There is a punctum externally that is nondraining. Swelling has decreased some from previous exam.    Assessment/ Plan: 36 y.o. male   1. Preseptal cellulitis of left upper eyelid Clinically improving. I do not think that he requires urgent referral to specialist or requires a CT scan at this time. There is no evidence of orbital cellulitis at this time. No focal neurologic deficits. I did recommend that he consider taking a probiotic while on the Augmentin if he is having diarrhea. I also asked that his work excuse him from the rest of this week so that he may continue doing regular warm compresses to the left upper eyelid. His anti-inflammatory was changed to meloxicam 15 mg. Advised that he may take this once a day with a meal as needed for pain or swelling. Do not take other NSAIDs while taking this medication. He voiced good understanding. She is return precautions and reasons for emergent evaluation in the emergency department reviewed with the patient. He voiced good understanding. He'll follow up as needed. - meloxicam (MOBIC) 15 MG tablet; Take 1 tablet (15 mg total) by mouth daily. (as needed for pain/  swelling)  Dispense: 15 tablet; Refill: 0   No orders of the defined types were placed in this encounter.  Meds ordered this encounter  Medications  . meloxicam (MOBIC) 15 MG tablet    Sig: Take 1 tablet (15 mg total) by mouth daily. (as needed for pain/ swelling)    Dispense:  15 tablet    Refill:  0     Ruben Baity Hulen Skains, DO [facility] [phone]

## 2017-07-22 ENCOUNTER — Ambulatory Visit (INDEPENDENT_AMBULATORY_CARE_PROVIDER_SITE_OTHER): Payer: BLUE CROSS/BLUE SHIELD | Admitting: Family Medicine

## 2017-07-22 VITALS — BP 127/87 | HR 79 | Temp 97.8°F | Ht 72.0 in | Wt 223.0 lb

## 2017-07-22 DIAGNOSIS — L03213 Periorbital cellulitis: Secondary | ICD-10-CM

## 2017-07-22 MED ORDER — MELOXICAM 15 MG PO TABS: 15.0000 mg | ORAL_TABLET | Freq: Every day | ORAL | 0 refills | Status: DC

## 2017-07-22 NOTE — Patient Instructions (Signed)
As we discussed, I want you to continue warm compresses 5-6 times daily. I have replaced your over-the-counter anti-inflammatory with meloxicam 15 mg. Take this tablet once a day as needed for pain with a meal. Do not take any other NSAID medications (ie ibuprofen, naproxen) while on this. You may take Tylenol if needed for breakthrough pain. If you develop pain with eye movement, a red eye, blurry vision, worsening redness or swelling, or fevers, please seek immediate medical attention.   Preseptal Cellulitis, Adult Preseptal cellulitis-also called periorbital cellulitis-is an infection that can affect your eyelid and the soft tissues or skin that surround your eye. The infection may also affect the structures that produce and drain your tears. It does not affect your eye itself. What are the causes? This condition may be caused by:  Bacterial infection.  Long-term (chronic) sinus infections.  An object (foreign body) that is stuck behind the eye.  An injury that: ? Goes through the eyelid tissues. ? Causes an infection, such as an insect sting.  Fracture of the bone around the eye.  Infections that have spread from the eyelid or other structures around the eye.  Bite wounds.  Inflammation or infection of the lining membranes of the brain (meningitis).  An infection in the blood (septicemia).  Dental infection (abscess).  Viral infection. This is rare.  What increases the risk? Risk factors for preseptal cellulitis include:  Participating in activities that increase your risk of trauma to the face or head, such as boxing or high-speed activities.  Having a weakened defense system (immune system).  Medical conditions, such as nasal polyps, that increase your risk for frequent or recurrent sinus infections.  Not receiving regular dental care.  What are the signs or symptoms? Symptoms of this condition usually come on suddenly. Symptoms may include:  Red, hot, and swollen  eyelids.  Fever.  Difficulty opening your eye.  Eye pain.  How is this diagnosed? This condition may be diagnosed by an eye exam. You may also have tests, such as:  Blood tests.  CT scan.  MRI.  Spinal tap (lumbar puncture). This is a procedure that involves removing and examining a small amount of the fluid that surrounds the brain and spinal cord. This checks for meningitis.  How is this treated? Treatment for this condition will include antibiotic medicines. These may be given by mouth (orally), through an IV, or as a shot. Your health care provider may also recommend nasal decongestants to reduce swelling. Follow these instructions at home:  Take your antibiotic medicine as directed by your health care provider. Finish all of it even if you start to feel better.  Take medicines only as directed by your health care provider.  Drink enough fluid to keep your urine clear or pale yellow.  Do not use any tobacco products, including cigarettes, chewing tobacco, or electronic cigarettes. If you need help quitting, ask your health care provider.  Keep all follow-up visits as directed by your health care provider. These include any visits with an eye specialist (ophthalmologist) or dentist. Contact a health care provider if:  You have a fever.  Your eyelids become more red, warm, or swollen.  You have new symptoms.  Your symptoms do not get better with treatment. Get help right away if:  You develop double vision, or your vision becomes blurred or worsens in any way.  You have trouble moving your eyes.  Your eye looks like it is sticking out or bulging out (proptosis).  You develop a severe headache, severe neck pain, or neck stiffness.  You develop repeated vomiting. This information is not intended to replace advice given to you by your health care provider. Make sure you discuss any questions you have with your health care provider. Document Released: 10/30/2010  Document Revised: 03/04/2016 Document Reviewed: 09/23/2014 Elsevier Interactive Patient Education  [employer].

## 2018-07-28 ENCOUNTER — Encounter: Payer: Self-pay | Admitting: Family Medicine

## 2018-09-12 ENCOUNTER — Ambulatory Visit (INDEPENDENT_AMBULATORY_CARE_PROVIDER_SITE_OTHER): Payer: 59 | Admitting: Family

## 2018-09-12 ENCOUNTER — Encounter: Payer: Self-pay | Admitting: Family

## 2018-09-12 VITALS — BP 122/90 | HR 77 | Temp 97.7°F | Ht 72.0 in | Wt 204.0 lb

## 2018-09-12 DIAGNOSIS — M79601 Pain in right arm: Secondary | ICD-10-CM | POA: Diagnosis not present

## 2018-09-12 MED ORDER — DICLOFENAC SODIUM 75 MG PO TBEC: 75.0000 mg | DELAYED_RELEASE_TABLET | Freq: Two times a day (BID) | ORAL | 0 refills | Status: DC

## 2018-09-12 NOTE — Progress Notes (Signed)
   Subjective:    Patient ID: Ruben PrestoJames Frerichs, male    DOB: Aug 17, 1981, 37 y.o.   MRN: [id number]  Chief Complaint  Patient presents with  . Arm Pain    right [facility] x 2 weeks [facility] Has noticeable weakness in that arm as well. Arm aches, especially at night. No redness or swelling. Needle stick seemed normal. No difficulty with accessing vein.     HPI Pt presents to the office today with right AC pain that started two weeks ago after having blood work drawn. Denies any erythemas, fever, swelling. He reports intermittent aching pain 5-6 out 10. He states this pain is unchanged. He has not taken anything for the pain.   Review of Systems  All other systems reviewed and are negative.      Objective:   Physical Exam  Constitutional: He is oriented to person, place, and time. He appears well-developed and well-nourished. No distress.  HENT:  Head: Normocephalic.  Eyes: Pupils are equal, round, and reactive to light. Right eye exhibits no discharge. Left eye exhibits no discharge.  Neck: Normal range of motion. Neck supple. No thyromegaly present.  Cardiovascular: Normal rate, regular rhythm, normal heart sounds and intact distal pulses.  No murmur heard. Pulmonary/Chest: Effort normal and breath sounds normal. No respiratory distress. He has no wheezes.  Abdominal: Soft. Bowel sounds are normal. He exhibits no distension. There is no tenderness.  Musculoskeletal: Normal range of motion. He exhibits no edema or tenderness.  No redness, swelling, or warmth noted. Full ROM of arm and not hand weakness  Neurological: He is alert and oriented to person, place, and time. He has normal reflexes. No cranial nerve deficit.  Skin: Skin is warm and dry. No rash noted. No erythema.  Psychiatric: He has a normal mood and affect. His behavior is normal. Judgment and thought content normal.  Vitals reviewed.     BP 122/90 (BP Location: Left Arm, Patient Position:  Sitting, Cuff Size: Normal)   Pulse 77   Temp 97.7 F (36.5 C)   Ht 6' (1.829 m)   Wt 204 lb (92.5 kg)   BMI 27.67 kg/m      Assessment & Plan:  Ruben Mejia comes in today with chief complaint of Arm Pain (right forearm [facility] x 2 weeks [facility] Has noticeable weakness in that arm as well. Arm aches, especially at night. No redness or swelling. Needle stick seemed normal. No difficulty with accessing vein. )   Diagnosis and orders addressed:  1. Right arm pain I believe this is related to nerve damage from his lab draw We will treat with NSAID's Rest RTO if symptoms worsen or do not improve  - diclofenac (VOLTAREN) 75 MG EC tablet; Take 1 tablet (75 mg total) by mouth 2 (two) times daily.  Dispense: 30 tablet; Refill: 0   Jannifer Rodneyhristy Hawks, FNP

## 2019-03-16 ENCOUNTER — Encounter: Payer: Self-pay | Admitting: Family Medicine

## 2019-03-16 ENCOUNTER — Ambulatory Visit (INDEPENDENT_AMBULATORY_CARE_PROVIDER_SITE_OTHER): Payer: 59 | Admitting: Family Medicine

## 2019-03-16 ENCOUNTER — Other Ambulatory Visit: Payer: Self-pay

## 2019-03-16 DIAGNOSIS — F32A Depression, unspecified: Secondary | ICD-10-CM

## 2019-03-16 DIAGNOSIS — F329 Major depressive disorder, single episode, unspecified: Secondary | ICD-10-CM

## 2019-03-16 DIAGNOSIS — F411 Generalized anxiety disorder: Secondary | ICD-10-CM | POA: Diagnosis not present

## 2019-03-16 MED ORDER — LORAZEPAM 0.5 MG PO TABS: 0.5000 mg | ORAL_TABLET | Freq: Two times a day (BID) | ORAL | 0 refills | Status: DC | PRN

## 2019-03-16 MED ORDER — FLUOXETINE HCL 20 MG PO CAPS: 20.0000 mg | ORAL_CAPSULE | Freq: Every day | ORAL | 1 refills | Status: DC

## 2019-03-16 NOTE — Progress Notes (Addendum)
Virtual visit  Subjective: CC: mood PCP: Raliegh Ip, DO OBS:Ruben Mejia is a 38 y.o. male calls for virtual consult today. Patient provides verbal consent for consult.  Location of patient: friend's home Location of provider: [facility] Others present for call: none  1. Mood Patient with longstanding history of both anxiety and depressive symptoms.  He reports that symptoms were exacerbated several years ago after the passing of his father.  He never sought counseling or treatment for this because he was trying to self cope.  He has been reluctant to talk to anyone about it for several years because he "thought he had his stuff together".  He notes isolating behaviors, using sleep as an escape, difficulty concentrating and difficulty with motivation.  He will have spells where he can he must her throat but recently he has been having more difficulty completing tasks, even at work.  His symptoms are affecting work.  He reports symptoms of anxiety including racing thoughts at bedtime which often interfere with his ability to fall asleep.  He feels fidgety quite a bit.  Symptoms certainly seem to be worsened by the stress that he has with several life changes that are coming up including moving in with his fiance and wedding planning for an upcoming marriage.  No hx ADHD, no previous treatment for anxiety or depression.  Family with possible history of anxiety disorder in mother and known history of depression in sister with previous suicidal attempt.  Patient has never had any self harming behaviors or harm to others.  He has had some thoughts of what life would be without him present but he has never had a plan or intent for harm to self.  He does occasionally engage in use of marijuana but notes that this is very rare.  He occasionally vapes.  He occasionally consumes alcohol but never more than 2 beers per sitting and never more than 6 beers per week.  He has good social support from his fiance,  who is a Education officer, environmental.  He is estranged from his mother and sisters.  He has friends but again is reluctant to talk to them thoroughly about his emotional stressors.    ROS: Per HPI  No Known Allergies Past Medical History:  Diagnosis Date  . Varicose veins    Current Outpatient Medications:  None  Physical exam Gen: Well-appearing, well-groomed.  No acute distress Pulmonology: Patient has normal work of breathing on room air Psych: Mood somewhat depressed and patient is intermittently tearful, speech normal, affect appropriate, pleasant and interactive.  Does not appear to be responding to internal stimuli  Depression screen [facility] 2/9 03/16/2019 07/22/2017 07/19/2017  Decreased Interest 3 0 0  Down, Depressed, Hopeless 3 0 0  PHQ - 2 Score 6 0 0  Altered sleeping 2 - -  Tired, decreased energy 3 - -  Change in appetite 3 - -  Feeling bad or failure about yourself  3 - -  Trouble concentrating 3 - -  Moving slowly or fidgety/restless 2 - -  Suicidal thoughts 2 - -  PHQ-9 Score 24 - -   GAD 7 : Generalized Anxiety Score 03/16/2019  Nervous, Anxious, on Edge 3  Control/stop worrying 3  Worry too much - different things 3  Trouble relaxing 3  Restless 2  Easily annoyed or irritable 3  Afraid - awful might happen 3  Total GAD 7 Score 20  Anxiety Difficulty Very difficult   Assessment/ Plan: 38 y.o. male   1.  Generalized anxiety disorder Patient with very positive PHQ 9 and gad 7 scores.  He is quite stoic but is intermittently tearful during exam.  I feel that at this point medication is appropriate.  We discussed consideration for counseling services but patient again is very reserved and reluctant to discuss his personal matters with others.  I felt like we were able to establish a pretty good rapport and have very good open conversation today.  I will be glad to continue following him for his mental health concerns and have encouraged him to contact me for follow-up in about 4 to 6  weeks.  May need to consider increasing Prozac at that time.  I have given him a small quantity of Ativan to have on hand to use for panic.  We discussed the addictive potential of this medication and discussed that it would not be considered for long-term use but rather for titration onto the SSRI.  We also discussed the possibility that the SSRI may exacerbate mania if he has an actual underlying bipolar disorder.  He is to contact me immediately should these symptoms develop.  Additionally, we did briefly discuss consideration for ADHD evaluation should he have continued issues with concentration despite resolution of depression and anxiety. - FLUoxetine (PROZAC) 20 MG capsule; Take 1 capsule (20 mg total) by mouth daily.  Dispense: 30 capsule; Refill: 1 - LORazepam (ATIVAN) 0.5 MG tablet; Take 1 tablet (0.5 mg total) by mouth 2 (two) times daily as needed for anxiety.  Dispense: 30 tablet; Refill: 0  2. Depressive disorder As above - FLUoxetine (PROZAC) 20 MG capsule; Take 1 capsule (20 mg total) by mouth daily.  Dispense: 30 capsule; Refill: 1   Start time: 1:45pm End time: 2:08pm  Total time spent on patient care (including telephone call/ virtual visit): 26 minutes   Hulen SkainsM , DO [facility] [phone]  The Narcotic Database has been reviewed.  There were no red flags.

## 2019-05-14 ENCOUNTER — Other Ambulatory Visit: Payer: Self-pay | Admitting: Family Medicine

## 2019-05-14 DIAGNOSIS — F411 Generalized anxiety disorder: Secondary | ICD-10-CM

## 2019-05-14 DIAGNOSIS — F32A Depression, unspecified: Secondary | ICD-10-CM

## 2019-05-14 DIAGNOSIS — F329 Major depressive disorder, single episode, unspecified: Secondary | ICD-10-CM

## 2019-05-14 MED ORDER — FLUOXETINE HCL 20 MG PO CAPS: 20.0000 mg | ORAL_CAPSULE | Freq: Every day | ORAL | 12 refills | Status: DC

## 2020-05-26 ENCOUNTER — Other Ambulatory Visit: Payer: Self-pay | Admitting: Family Medicine

## 2020-05-26 DIAGNOSIS — F32A Depression, unspecified: Secondary | ICD-10-CM

## 2020-05-26 DIAGNOSIS — F329 Major depressive disorder, single episode, unspecified: Secondary | ICD-10-CM

## 2020-05-26 DIAGNOSIS — F411 Generalized anxiety disorder: Secondary | ICD-10-CM

## 2020-05-26 MED ORDER — FLUOXETINE HCL 20 MG PO CAPS: 20.0000 mg | ORAL_CAPSULE | Freq: Every day | ORAL | 12 refills | Status: DC

## 2021-07-08 ENCOUNTER — Encounter: Payer: Self-pay | Admitting: Family Medicine

## 2021-07-08 ENCOUNTER — Ambulatory Visit (INDEPENDENT_AMBULATORY_CARE_PROVIDER_SITE_OTHER): Payer: 59 | Admitting: Family Medicine

## 2021-07-08 DIAGNOSIS — F329 Major depressive disorder, single episode, unspecified: Secondary | ICD-10-CM | POA: Diagnosis not present

## 2021-07-08 DIAGNOSIS — F411 Generalized anxiety disorder: Secondary | ICD-10-CM | POA: Diagnosis not present

## 2021-07-08 DIAGNOSIS — F32A Depression, unspecified: Secondary | ICD-10-CM

## 2021-07-08 MED ORDER — LORAZEPAM 0.5 MG PO TABS: 0.5000 mg | ORAL_TABLET | Freq: Two times a day (BID) | ORAL | 0 refills | Status: AC | PRN

## 2021-07-08 MED ORDER — FLUOXETINE HCL 40 MG PO CAPS
40.0000 mg | ORAL_CAPSULE | Freq: Every day | ORAL | 12 refills | Status: DC
Start: 2021-07-08 — End: 2022-06-02

## 2021-07-08 NOTE — Progress Notes (Signed)
MyChart Video visit  Subjective: CC: GAD/ Depression PCP: Raliegh Ip, DO BJY:[facility] Ruben Mejia is a 40 y.o. male. Patient provides verbal consent for consult held via video.  Due to COVID-19 pandemic this visit was conducted virtually. This visit type was conducted due to national recommendations for restrictions regarding the COVID-19 Pandemic (e.g. social distancing, sheltering in place) in an effort to limit this patient's exposure and mitigate transmission in our community. All issues noted in this document were discussed and addressed.  A physical exam was not performed with this format.   Location of patient: car Location of provider: [facility] Others present for call: none  1.  Anxiety depression Patient reports exacerbation of anxiety, mood lability.  He notes that about a month ago he discontinued his Prozac because he felt like it was not really working and thought perhaps he might be able to just come off of it.  He admits that prior to discontinuation he was having some mood lability but this was certainly exacerbated within a week of discontinuation.  He cites feeling tearful and angry at the same time.  He describes having a short fuse at work.  He denies any SI, HI.  Sleep has been impacted as well from anxiety symptoms.  He denies regular alcohol use.  He has used marijuana in the past but recognizes that is something that exacerbated his anxiety so he does not do that anymore.  His mood lability is affecting home life and work life and he really would like to try and go back on medication.  Denies any sexual dysfunction with the SSRI.   ROS: Per HPI  No Known Allergies Past Medical History:  Diagnosis Date   Varicose veins     Current Outpatient Medications:    FLUoxetine (PROZAC) 20 MG capsule, Take 1 capsule (20 mg total) by mouth daily., Disp: 30 capsule, Rfl: 12   LORazepam (ATIVAN) 0.5 MG tablet, Take 1 tablet (0.5 mg total) by mouth 2 (two) times daily as needed for  anxiety., Disp: 30 tablet, Rfl: 0  General: Nontoxic-appearing male Psych: Good eye contact.  Mood is depressed.  Does not appear to be responding to internal stimuli.  Thought process linear.  Assessment/ Plan: 40 y.o. male   Generalized anxiety disorder - Plan: FLUoxetine (PROZAC) 40 MG capsule, LORazepam (ATIVAN) 0.5 MG tablet  Depressive disorder - Plan: FLUoxetine (PROZAC) 40 MG capsule  Exacerbation of anxiety and depressive disorder manifesting as mood lability.  I suspect much of this has to do with abrupt discontinuation of the fluoxetine.  It sounds like the response to fluoxetine 20 mg as of late has been suboptimal and therefore we have advanced his dose to 40 mg daily.  I have given him a renewal on as needed Ativan to use very sparingly if needed for breakthrough panic and anxiety attacks.  Caution sedation.  Advised against use of heavy machinery including driving, use of alcohol.  He voiced good understanding of the plan.  We will reconvene in about 4 to 6 weeks to reevaluate him.  If he still having ongoing anxiety symptoms we discussed consideration for addition of BuSpar.  He seemed amenable to this.  We will follow-up as directed.  Start time: 8:45am End time: 8:57am  Total time spent on patient care (including video visit/ documentation): 12 minutes  Lakira Ogando Hulen Skains, DO [facility] [phone]

## 2021-07-10 ENCOUNTER — Ambulatory Visit: Payer: Self-pay | Admitting: Family Medicine

## 2022-06-02 ENCOUNTER — Ambulatory Visit: Payer: Managed Care, Other (non HMO)

## 2022-06-02 ENCOUNTER — Encounter: Payer: Self-pay | Admitting: Family Medicine

## 2022-06-02 VITALS — BP 112/71 | HR 61 | Temp 96.9°F | Resp 20 | Ht 72.0 in | Wt 213.0 lb

## 2022-06-02 DIAGNOSIS — R195 Other fecal abnormalities: Secondary | ICD-10-CM

## 2022-06-02 DIAGNOSIS — F32A Depression, unspecified: Secondary | ICD-10-CM

## 2022-06-02 DIAGNOSIS — E663 Overweight: Secondary | ICD-10-CM

## 2022-06-02 DIAGNOSIS — F411 Generalized anxiety disorder: Secondary | ICD-10-CM | POA: Diagnosis not present

## 2022-06-02 DIAGNOSIS — Z114 Encounter for screening for human immunodeficiency virus [HIV]: Secondary | ICD-10-CM

## 2022-06-02 DIAGNOSIS — R82998 Other abnormal findings in urine: Secondary | ICD-10-CM | POA: Diagnosis not present

## 2022-06-02 DIAGNOSIS — Z125 Encounter for screening for malignant neoplasm of prostate: Secondary | ICD-10-CM

## 2022-06-02 DIAGNOSIS — J45990 Exercise induced bronchospasm: Secondary | ICD-10-CM

## 2022-06-02 DIAGNOSIS — I83813 Varicose veins of bilateral lower extremities with pain: Secondary | ICD-10-CM

## 2022-06-02 DIAGNOSIS — Z1159 Encounter for screening for other viral diseases: Secondary | ICD-10-CM

## 2022-06-02 LAB — MICROSCOPIC EXAMINATION
Bacteria, UA: NONE SEEN
Epithelial Cells (non renal): NONE SEEN /hpf (ref 0–10)
RBC, Urine: NONE SEEN /hpf (ref 0–2)
Renal Epithel, UA: NONE SEEN /hpf
WBC, UA: NONE SEEN /hpf (ref 0–5)

## 2022-06-02 LAB — URINALYSIS, ROUTINE W REFLEX MICROSCOPIC
Bilirubin, UA: NEGATIVE
Glucose, UA: NEGATIVE
Ketones, UA: NEGATIVE
Leukocytes,UA: NEGATIVE
Nitrite, UA: NEGATIVE
Protein,UA: NEGATIVE
Specific Gravity, UA: 1.02 (ref 1.005–1.030)
Urobilinogen, Ur: 0.2 mg/dL (ref 0.2–1.0)
pH, UA: 7 (ref 5.0–7.5)

## 2022-06-02 LAB — CMP14+EGFR
AST: 30 IU/L (ref 0–40)
Albumin/Globulin Ratio: 1.8 (ref 1.2–2.2)
Albumin: 4.4 g/dL (ref 4.1–5.1)
CO2: 23 mmol/L (ref 20–29)
Calcium: 9.6 mg/dL (ref 8.7–10.2)
Chloride: 104 mmol/L (ref 96–106)
Globulin, Total: 2.5 g/dL (ref 1.5–4.5)
Total Protein: 6.9 g/dL (ref 6.0–8.5)

## 2022-06-02 LAB — CBC
Hematocrit: 45.6 % (ref 37.5–51.0)
MCH: 30.3 pg (ref 26.6–33.0)
MCV: 86 fL (ref 79–97)
RDW: 11.5 % — ABNORMAL LOW (ref 11.6–15.4)

## 2022-06-02 LAB — BAYER DCA HB A1C WAIVED: HB A1C (BAYER DCA - WAIVED): 5.1 % (ref 4.8–5.6)

## 2022-06-02 LAB — LIPID PANEL

## 2022-06-02 LAB — HIV ANTIBODY (ROUTINE TESTING W REFLEX)

## 2022-06-02 LAB — TSH

## 2022-06-02 MED ORDER — BUPROPION HCL ER (XL) 150 MG PO TB24: 150.0000 mg | ORAL_TABLET | Freq: Every day | ORAL | 3 refills | Status: DC

## 2022-06-02 MED ORDER — ALBUTEROL SULFATE HFA 108 (90 BASE) MCG/ACT IN AERS: INHALATION_SPRAY | RESPIRATORY_TRACT | 2 refills | Status: DC

## 2022-06-02 MED ORDER — FLUOXETINE HCL 40 MG PO CAPS: 40.0000 mg | ORAL_CAPSULE | Freq: Every day | ORAL | 3 refills | Status: DC

## 2022-06-02 NOTE — Progress Notes (Signed)
Subjective: CC: Varicose veins PCP: Janora Norlander, DO PPI:RJJOA Hove is a 41 y.o. male presenting to clinic today for:  1.  Varicose veins Patient continues to have difficulty with his varicose veins.  They seem to be enlarging and become increasingly more painful.  He is now ready to see a vascular surgeon to discuss treatment for these.  2.  Foamy urine His wife is becoming increasingly concerned about foamy urine.  He notes that this has been present for at least the last couple of months but he really did not notice it prior to that time.  He denies any use of NSAID medications.  No known family history for a biologic standpoint of renal disease.  He had no unplanned weight loss, night sweats, rashes, scrotal abnormalities.  No penile discharge or pain.  No hematuria or fevers.  No flank pain.  He has had no changes in diet.  He eats a balanced diet and drinks plenty of water.  He does exercise regularly but notes no increase in exercise, if anything he has had a slight decrease in exercise.  3.  Abnormal stool smell He reports his wife feels like his stool smelled like burnt hair.  He does not appreciate this but he wanted to get it checked out anyways.  He does have a history of intermittent rectal bleeding that has occurred up to twice per year and he thinks this is related to hemorrhoids.  He is not treated for hemorrhoids currently nor does he report any rectal itching or pain.  No diarrheal stools.  Bowel movements have been fairly regular.   4.  Bronchospasm Patient does CrossFit in the mornings and after high intensity training he will have significant shortness of breath such that he feels like he is almost having a panic attack.  He has no problems running 5 miles however.  He did have exercise-induced asthma when he was younger and albuterol inhalers were helpful.  He does not smoke cigarettes.  He in the past has very rarely engaged in marijuana use.  No hemoptysis,  shortness of breath or wheezing otherwise.  No chest pain reported.  5.  Anxiety and depression Patient reports that he is feeling much better than the last time I saw him.  The fluoxetine 40 mg has really made a difference.  He still has almost an entire bottle of the Ativan and has really not use this at all but has it on hand for emergencies.  He does report difficulty with focus but does admit that his workload has changed recently and things have been a little overwhelming at work.  He would be interested in going on something that would help with some of the mood and focus.   ROS: Per HPI  No Known Allergies Past Medical History:  Diagnosis Date   Varicose veins     Current Outpatient Medications:    FLUoxetine (PROZAC) 40 MG capsule, Take 1 capsule (40 mg total) by mouth daily., Disp: 30 capsule, Rfl: 12   LORazepam (ATIVAN) 0.5 MG tablet, Take 1 tablet (0.5 mg total) by mouth 2 (two) times daily as needed for anxiety., Disp: 30 tablet, Rfl: 0 Social History   Socioeconomic History   Marital status: Married    Spouse name: Not on file   Number of children: Not on file   Years of education: Not on file   Highest education level: Not on file  Occupational History   Not on file  Tobacco Use  Smoking status: Former    Types: Cigarettes    Quit date: 02/25/2013    Years since quitting: 9.2   Smokeless tobacco: Former    Quit date: 02/26/2000  Vaping Use   Vaping Use: Former  Substance and Sexual Activity   Alcohol use: Yes    Alcohol/week: 10.0 standard drinks of alcohol    Types: 10 Cans of beer per week   Drug use: No   Sexual activity: Yes  Other Topics Concern   Not on file  Social History Narrative   Not on file   Social Determinants of Health   Financial Resource Strain: Not on file  Food Insecurity: Not on file  Transportation Needs: Not on file  Physical Activity: Not on file  Stress: Not on file  Social Connections: Not on file  Intimate Partner  Violence: Not on file   Family History  Problem Relation Age of Onset   Heart failure Father    Heart disease Father     Objective: Office vital signs reviewed. BP 112/71   Pulse 61   Temp (!) 96.9 F (36.1 C)   Resp 20   Ht 6' (1.829 m)   Wt 213 lb (96.6 kg)   SpO2 97%   BMI 28.89 kg/m   Physical Examination:  General: Awake, alert, well nourished, No acute distress HEENT: Sclera white.  No conjunctival pallor.  No thyromegaly or thyroid masses.  No lymphadenopathy appreciated along the neck or supraclavicular spaces. Cardio: regular rate and rhythm, S1S2 heard, no murmurs appreciated Pulm: clear to auscultation bilaterally, no wheezes, rhonchi or rales; normal work of breathing on room air GI: soft, non-tender, non-distended, bowel sounds present x4, no hepatomegaly, no splenomegaly, no masses GU: No pain over the bladder.  No CVA tenderness. Extremities: warm, well perfused, No edema, cyanosis or clubbing; +2 pulses bilaterally MSK: Normal gait and station Skin: dry; intact; no rashes.  He has very prominent varicose veins throughout bilateral lower extremities with the most severe lesions within the thighs. Neuro: No focal neurologic deficits Psych: Mood stable, speech normal, affect appropriate.  Very pleasant and interactive     03/16/2019    2:03 PM 07/22/2017    9:09 AM 07/19/2017   10:29 AM  Depression screen PHQ 2/9  Decreased Interest 3 0 0  Down, Depressed, Hopeless 3 0 0  PHQ - 2 Score 6 0 0  Altered sleeping 2    Tired, decreased energy 3    Change in appetite 3    Feeling bad or failure about yourself  3    Trouble concentrating 3    Moving slowly or fidgety/restless 2    Suicidal thoughts 2    PHQ-9 Score 24        03/16/2019    2:05 PM  GAD 7 : Generalized Anxiety Score  Nervous, Anxious, on Edge 3  Control/stop worrying 3  Worry too much - different things 3  Trouble relaxing 3  Restless 2  Easily annoyed or irritable 3  Afraid - awful might  happen 3  Total GAD 7 Score 20  Anxiety Difficulty Very difficult    Assessment/ Plan: 41 y.o. male   Varicose veins of both lower extremities with pain - Plan: CMP14+EGFR, CBC, Ambulatory referral to Vascular Surgery  Generalized anxiety disorder - Plan: TSH, FLUoxetine (PROZAC) 40 MG capsule  Depressive disorder - Plan: FLUoxetine (PROZAC) 40 MG capsule, buPROPion (WELLBUTRIN XL) 150 MG 24 hr tablet  Foamy urine - Plan: Urinalysis, Routine w  reflex microscopic, CMP14+EGFR, Bayer DCA Hb A1c Waived, HIV antibody (with reflex), ANA w/Reflex if Positive  Screening for HIV (human immunodeficiency virus)  Encounter for hepatitis C screening test for low risk patient - Plan: HIV antibody (with reflex), Hepatitis C antibody  Overweight (BMI 25.0-29.9) - Plan: Lipid Panel  Screening for malignant neoplasm of prostate - Plan: PSA  Exercise induced bronchospasm - Plan: albuterol (VENTOLIN HFA) 108 (90 Base) MCG/ACT inhaler  Referral to vascular for symptomatic varicose veins that have been progressive over the last several years  Anxiety disorder is stable but attention and mood need a little bit of a boost so Wellbutrin added.  Uncertain etiology of foamy urine but will evaluate for any possible causes including autoimmune mediated, diabetes.  Check renal function.  Check for infectious potential though no known exposure to HIV or hepatitis C.  If proteinuria is present but his labs are unrevealing, I will refer to nephrology for further evaluation of rarer syndromes like Fabry disease, minimal-change disease etc.  Nonfasting lipid obtained  Asymptomatic from a prostate standpoint but screening PSA collected  For his exercise-induced bronchospasm albuterol given.  We discussed that if needing more than 3 times per week I would like to place him on a chronic controller inhaler and he will let me know if this is needed  FOBT provided.  Uncertain etiology of change in stool smell but I  would certainly have a low threshold to refer to gastroenterology if he has had some undiagnosed rectal bleeding given the rising prevalence of colorectal cancers in this age group  I would like to see him back in 2 months for recheck on how meds are working and to complete his annual physical exam  No orders of the defined types were placed in this encounter.  No orders of the defined types were placed in this encounter.    Janora Norlander, DO [place] [phone]

## 2022-06-02 NOTE — Patient Instructions (Signed)
I'll call you tomorrow with results.  Wellbutrin sent for focus and to boost antidepressant effect of your Fluoxetine.  Sometimes this causes decreased appetite.  This is a typical side effect.  Albuterol for as needed use.  If using >3x/ week, let me know and I will send a daily inhaler in for you  Referral to vascular in place

## 2022-06-03 LAB — ANA W/REFLEX IF POSITIVE: Anti Nuclear Antibody (ANA): NEGATIVE

## 2022-06-03 LAB — CMP14+EGFR
ALT: 35 IU/L (ref 0–44)
Alkaline Phosphatase: 79 IU/L (ref 44–121)
BUN: 21 mg/dL (ref 6–24)
Bilirubin Total: 0.3 mg/dL (ref 0.0–1.2)
Glucose: 106 mg/dL — ABNORMAL HIGH (ref 70–99)
Potassium: 3.9 mmol/L (ref 3.5–5.2)
Sodium: 141 mmol/L (ref 134–144)

## 2022-06-03 LAB — PSA: Prostate Specific Ag, Serum: 0.4 ng/mL (ref 0.0–4.0)

## 2022-06-03 LAB — LIPID PANEL
Chol/HDL Ratio: 3.4 ratio (ref 0.0–5.0)
Cholesterol, Total: 213 mg/dL — ABNORMAL HIGH (ref 100–199)
HDL: 63 mg/dL (ref >39)
Triglycerides: 63 mg/dL (ref 0–149)

## 2022-06-03 LAB — CBC
Hemoglobin: 16.1 g/dL (ref 13.0–17.7)
MCHC: 35.3 g/dL (ref 31.5–35.7)
Platelets: 278 10*3/uL (ref 150–450)
RBC: 5.32 x10E6/uL (ref 4.14–5.80)
WBC: 4.8 10*3/uL (ref 3.4–10.8)

## 2022-06-03 LAB — HEPATITIS C ANTIBODY: Hep C Virus Ab: NONREACTIVE

## 2022-06-25 ENCOUNTER — Ambulatory Visit (INDEPENDENT_AMBULATORY_CARE_PROVIDER_SITE_OTHER): Payer: Managed Care, Other (non HMO) | Admitting: Family Medicine

## 2022-06-25 ENCOUNTER — Encounter: Payer: Self-pay | Admitting: Family Medicine

## 2022-06-25 ENCOUNTER — Ambulatory Visit: Payer: Managed Care, Other (non HMO) | Admitting: Family Medicine

## 2022-06-25 DIAGNOSIS — J020 Streptococcal pharyngitis: Secondary | ICD-10-CM

## 2022-06-25 MED ORDER — AMOXICILLIN 500 MG PO CAPS: 500.0000 mg | ORAL_CAPSULE | Freq: Two times a day (BID) | ORAL | 0 refills | Status: AC

## 2022-06-25 NOTE — Progress Notes (Signed)
Telephone visit  Subjective: Ruben Mejia throat PCP: Raliegh Ip, DO EHU:DJSHF [name] is a 41 y.o. male calls for telephone consult today. Patient provides verbal consent for consult held via phone.  Due to COVID-19 pandemic this visit was conducted virtually. This visit type was conducted due to national recommendations for restrictions regarding the COVID-19 Pandemic (e.g. social distancing, sheltering in place) in an effort to limit this patient's exposure and mitigate transmission in our community. All issues noted in this document were discussed and addressed.  A physical exam was not performed with this format.   Location of patient: work Location of provider: [facility] Others present for call: none  1. Strep throat Patient's son and wife are ill with strep.  He has had a couple day history of sore throat, hoarseness.  Negative COVID at home.  No fevers.  Has a little rash on arm.  Reports arthragia but no nodules. No shortness of breath. No vomiting.  Reports mild nausea (?wellbutrin induced). No headache.   ROS: Per HPI  No Known Allergies Past Medical History:  Diagnosis Date   Varicose veins     Current Outpatient Medications:    albuterol (VENTOLIN HFA) 108 (90 Base) MCG/ACT inhaler, Use 2 puffs 15 minutes before exercise.  May inhale 2 puffs every 6 hours as needed for wheezing or shortness of breath, Disp: 8 g, Rfl: 2   buPROPion (WELLBUTRIN XL) 150 MG 24 hr tablet, Take 1 tablet (150 mg total) by mouth daily., Disp: 90 tablet, Rfl: 3   FLUoxetine (PROZAC) 40 MG capsule, Take 1 capsule (40 mg total) by mouth daily., Disp: 90 capsule, Rfl: 3   LORazepam (ATIVAN) 0.5 MG tablet, Take 1 tablet (0.5 mg total) by mouth 2 (two) times daily as needed for anxiety., Disp: 30 tablet, Rfl: 0  Assessment/ Plan: 41 y.o. male   Strep pharyngitis - Plan: amoxicillin (AMOXIL) 500 MG capsule  Amoxicillin twice daily for 10 days.  Discussed red flag signs and symptoms warranting further  evaluation and/or emergent evaluation.  He voiced good understanding of follow-up as needed  Start time: 8:31am End time: 8:36a  Total time spent on patient care (including telephone call/ virtual visit): 5 minutes  Jshaun Abernathy Hulen Skains, DO [facility] [phone]

## 2022-06-25 NOTE — Patient Instructions (Signed)
Strep Throat, Adult Strep throat is an infection of the throat. It is caused by germs (bacteria). Strep throat is common during the cold months of the year. It mostly affects children who are 5-42 years old. However, people of all ages can get it at any time of the year. This infection spreads from person to person through coughing, sneezing, or having close contact. What are the causes? This condition is caused by the Streptococcus pyogenes germ. What increases the risk? You care for young children. Children are more likely to get strep throat and may spread it to others. You go to crowded places. Germs can spread easily in such places. You kiss or touch someone who has strep throat. What are the signs or symptoms? Fever or chills. Redness, swelling, or pain in the tonsils or throat. Pain or trouble when swallowing. White or yellow spots on the tonsils or throat. Tender glands in the neck and under the jaw. Bad breath. Red rash all over the body. This is rare. How is this treated? Medicines that kill germs (antibiotics). Medicines that treat pain or fever. These include: Ibuprofen or acetaminophen. Aspirin, only for people who are over the age of 18. Cough drops. Throat sprays. Follow these instructions at home: Medicines  Take over-the-counter and prescription medicines only as told by your doctor. Take your antibiotic medicine as told by your doctor. Do not stop taking the antibiotic even if you start to feel better. Eating and drinking  If you have trouble swallowing, eat soft foods until your throat feels better. Drink enough fluid to keep your pee (urine) pale yellow. To help with pain, you may have: Warm fluids, such as soup and tea. Cold fluids, such as frozen desserts or popsicles. General instructions Rinse your mouth (gargle) with a salt-water mixture 3-4 times a day or as needed. To make a salt-water mixture, dissolve -1 tsp (3-6 g) of salt in 1 cup (237 mL) of warm  water. Rest as much as you can. Stay home from work or school until you have been taking antibiotics for 24 hours. Do not smoke or use any products that contain nicotine or tobacco. If you need help quitting, ask your doctor. Keep all follow-up visits. How is this prevented?  Do not share food, drinking cups, or personal items. They can cause the germs to spread. Wash your hands well with soap and water. Make sure that all people in your house wash their hands well. Have family members tested if they have a fever or a sore throat. They may need an antibiotic if they have strep throat. Contact a doctor if: You have swelling in your neck that keeps getting bigger. You get a rash, cough, or earache. You cough up a thick fluid that is green, yellow-brown, or bloody. You have pain that does not get better with medicine. Your symptoms get worse instead of getting better. You have a fever. Get help right away if: You vomit. You have a very bad headache. Your neck hurts or feels stiff. You have chest pain or are short of breath. You have drooling, very bad throat pain, or changes in your voice. Your neck is swollen, or the skin gets red and tender. Your mouth is dry, or you are peeing less than normal. You keep feeling more tired or have trouble waking up. Your joints are red or painful. These symptoms may be an emergency. Do not wait to see if the symptoms will go away. Get help right away. Call   your local emergency services (911 in the [place] Summary Strep throat is an infection of the throat. It is caused by germs (bacteria). This infection can spread from person to person through coughing, sneezing, or having close contact. Take your medicines, including antibiotics, as told by your doctor. Do not stop taking the antibiotic even if you start to feel better. To prevent the spread of germs, wash your hands well with soap and water. Have others do the same. Do not share food, drinking cups,  or personal items. Get help right away if you have a bad headache, chest pain, shortness of breath, a stiff or painful neck, or you vomit. This information is not intended to replace advice given to you by your health care provider. Make sure you discuss any questions you have with your health care provider. Document Revised: 01/20/2021 Document Reviewed: 01/20/2021 Elsevier Patient Education  2023 Elsevier Inc.  

## 2022-08-04 ENCOUNTER — Encounter: Payer: Self-pay | Admitting: Family Medicine

## 2022-08-04 ENCOUNTER — Telehealth: Payer: Managed Care, Other (non HMO) | Admitting: Family Medicine

## 2022-08-04 DIAGNOSIS — F32A Depression, unspecified: Secondary | ICD-10-CM | POA: Diagnosis not present

## 2022-08-04 DIAGNOSIS — R110 Nausea: Secondary | ICD-10-CM

## 2022-08-04 MED ORDER — ONDANSETRON 4 MG PO TBDP: 4.0000 mg | ORAL_TABLET | Freq: Three times a day (TID) | ORAL | 0 refills | Status: AC | PRN

## 2022-08-04 MED ORDER — BUPROPION HCL ER (XL) 300 MG PO TB24: 300.0000 mg | ORAL_TABLET | Freq: Every day | ORAL | 0 refills | Status: DC

## 2022-08-04 NOTE — Progress Notes (Signed)
MyChart Video visit  Subjective: ZD:[id number] disorder PCP: Janora Norlander, DO VZD:GLOVF Ruben Mejia is a 41 y.o. male. Patient provides verbal consent for consult held via video.  Due to COVID-19 pandemic this visit was conducted virtually. This visit type was conducted due to national recommendations for restrictions regarding the COVID-19 Pandemic (e.g. social distancing, sheltering in place) in an effort to limit this patient's exposure and mitigate transmission in our community. All issues noted in this document were discussed and addressed.  A physical exam was not performed with this format.   Location of patient: work Location of provider: [facility] Others present for call: none  1. Depressive disorder The wellbutrin XL 150 was helpful in the beginning (?placebo effect).  He reports no significant improvement in symptoms now.  Had a panic attack out of the blue last night.  Did not use ativan.  He continues to have depressive symptoms.  The nausea that started when he added the Wellbutrin initially has totally resolved.   ROS: Per HPI  No Known Allergies Past Medical History:  Diagnosis Date   Varicose veins     Current Outpatient Medications:    albuterol (VENTOLIN HFA) 108 (90 Base) MCG/ACT inhaler, Use 2 puffs 15 minutes before exercise.  May inhale 2 puffs every 6 hours as needed for wheezing or shortness of breath, Disp: 8 g, Rfl: 2   buPROPion (WELLBUTRIN XL) 150 MG 24 hr tablet, Take 1 tablet (150 mg total) by mouth daily., Disp: 90 tablet, Rfl: 3   FLUoxetine (PROZAC) 40 MG capsule, Take 1 capsule (40 mg total) by mouth daily., Disp: 90 capsule, Rfl: 3   LORazepam (ATIVAN) 0.5 MG tablet, Take 1 tablet (0.5 mg total) by mouth 2 (two) times daily as needed for anxiety., Disp: 30 tablet, Rfl: 0  Gen: well appearing male. NAD Psych: mood stable, speech normal. Good eye contact  Assessment/ Plan: 41 y.o. male   Depressive disorder - Plan: buPROPion (WELLBUTRIN XL) 300 MG  24 hr tablet  Nausea - Plan: ondansetron (ZOFRAN-ODT) 4 MG disintegrating tablet  Unfortunately, not responding the 150mg  wellbutrin.  Offered wean and transition to low dose Abilify to use with prozac vs referral to [place] Dr Nehemiah Settle but he elected to try a higher dose of the wellbutrin.  Advance wellbutrin dose to 300mg .    Zofran sent for as needed use given previous nausea induced by addition of Wellbutrin.  Follow-up in 4 to 6 weeks.  If no significant improvements with the adjusted dose we will proceed with one of the other plans as aforementioned  Start time: 12:24pm End time: 12:34p  Total time spent on patient care (including video visit/ documentation): 10 minutes  [place], [place] [phone]

## 2022-09-09 ENCOUNTER — Other Ambulatory Visit: Payer: Self-pay | Admitting: *Deleted

## 2022-09-09 DIAGNOSIS — I8393 Asymptomatic varicose veins of bilateral lower extremities: Secondary | ICD-10-CM

## 2022-09-15 NOTE — Progress Notes (Unsigned)
Requested by:  Raliegh Ip, DO [address] [place],  [place] [address]  Reason for consultation: painful RLE varicose veins    History of Present Illness   Ruben Mejia is a 41 y.o. (03-13-1981) male who presents for evaluation of painful RLE varicose veins.  The patient has previously been seen by our office in 2016 and was started on conservative therapy including compression stockings and elevating the legs as needed.  Since then his varicose veins in the right lower extremity and leg swelling has gotten much worse.  He states that he sleeps in knee-high compression stockings and wears thigh-high compression stockings at work daily.  The varicose veins around his right knee have gotten much bigger and hurt him throughout the day.  This causes an aching and burning pain.  He denies any issues with bleeding or ulceration.  He denies any history of vein procedures or DVT.  Past Medical History:  Diagnosis Date   Varicose veins     No past surgical history on file.  Social History   Socioeconomic History   Marital status: Married    Spouse name: Not on file   Number of children: Not on file   Years of education: Not on file   Highest education level: Not on file  Occupational History   Not on file  Tobacco Use   Smoking status: Former    Types: Cigarettes    Quit date: 02/25/2013    Years since quitting: 9.5   Smokeless tobacco: Former    Quit date: 02/26/2000  Vaping Use   Vaping Use: Former  Substance and Sexual Activity   Alcohol use: Yes    Alcohol/week: 10.0 standard drinks of alcohol    Types: 10 Cans of beer per week   Drug use: No   Sexual activity: Yes  Other Topics Concern   Not on file  Social History Narrative   Not on file   Social Determinants of Health   Financial Resource Strain: Not on file  Food Insecurity: Not on file  Transportation Needs: Not on file  Physical Activity: Not on file  Stress: Not on file  Social Connections: Not on  file  Intimate Partner Violence: Not on file    Family History  Problem Relation Age of Onset   Heart failure Father    Heart disease Father     Current Outpatient Medications  Medication Sig Dispense Refill   albuterol (VENTOLIN HFA) 108 (90 Base) MCG/ACT inhaler Use 2 puffs 15 minutes before exercise.  May inhale 2 puffs every 6 hours as needed for wheezing or shortness of breath 8 g 2   buPROPion (WELLBUTRIN XL) 300 MG 24 hr tablet Take 1 tablet (300 mg total) by mouth daily. 90 tablet 0   FLUoxetine (PROZAC) 40 MG capsule Take 1 capsule (40 mg total) by mouth daily. 90 capsule 3   LORazepam (ATIVAN) 0.5 MG tablet Take 1 tablet (0.5 mg total) by mouth 2 (two) times daily as needed for anxiety. 30 tablet 0   ondansetron (ZOFRAN-ODT) 4 MG disintegrating tablet Take 1 tablet (4 mg total) by mouth every 8 (eight) hours as needed for nausea or vomiting. 20 tablet 0   No current facility-administered medications for this visit.    No Known Allergies  REVIEW OF SYSTEMS (negative unless checked):   Cardiac:  []  Chest pain or chest pressure? []  Shortness of breath upon activity? []  Shortness of breath when lying flat? []  Irregular heart rhythm?  Vascular:  []  Pain in calf, thigh, or hip brought on by walking? []  Pain in feet at night that wakes you up from your sleep? []  Blood clot in your veins? [x]  Leg swelling?  Pulmonary:  []  Oxygen at home? []  Productive cough? []  Wheezing?  Neurologic:  []  Sudden weakness in arms or legs? []  Sudden numbness in arms or legs? []  Sudden onset of difficult speaking or slurred speech? []  Temporary loss of vision in one eye? []  Problems with dizziness?  Gastrointestinal:  []  Blood in stool? []  Vomited blood?  Genitourinary:  []  Burning when urinating? []  Blood in urine?  Psychiatric:  []  Major depression  Hematologic:  []  Bleeding problems? []  Problems with blood clotting?  Dermatologic:  []  Rashes or  ulcers?  Constitutional:  []  Fever or chills?  Ear/Nose/Throat:  []  Change in hearing? []  Nose bleeds? []  Sore throat?  Musculoskeletal:  []  Back pain? []  Joint pain? []  Muscle pain?   Physical Examination     Vitals:   09/16/22 1436  BP: (!) 131/93  Pulse: 78  Temp: 98 F (36.7 C)  TempSrc: Temporal  SpO2: 97%  Weight: 218 lb (98.9 kg)  Height: 5\' 11"  (1.803 m)   Body mass index is 30.4 kg/m.  General:  WDWN in NAD; vital signs documented above Gait: Not observed HENT: WNL, normocephalic Pulmonary: normal non-labored breathing, CTAB Cardiac: regular HR, without murmurs without carotid bruit Abdomen: soft, NT, no masses Skin: without rashes Vascular Exam/Pulses: Palpable DP pulses bilaterally Extremities: with varicose veins, with reticular veins, with edema, without stasis pigmentation, without lipodermatosclerosis, without ulcers Musculoskeletal: no muscle wasting or atrophy  Neurologic: A&O X 3;  No focal weakness or paresthesias are detected Psychiatric:  The pt has Normal affect.  Non-invasive Vascular Imaging   RLE Venous Insufficiency Duplex (09/16/2022):   RIGHT           Reflux NoReflux  Reflux   Diameter  Comments                                          Yes     Time       cms                            +----------------+---------+------+----------+-----------+-----------------  ----+  CFV                      yes  >1 second                                    +----------------+---------+------+----------+-----------+-----------------  ----+  FV prox         no                                                          +----------------+---------+------+----------+-----------+-----------------  ----+  FV mid                    yes  >1 second            duplicated vein         +----------------+---------+------+----------+-----------+-----------------  ----+  FV dist                   yes  >1 second             duplicated vein         +----------------+---------+------+----------+-----------+-----------------  ----+  Popliteal                yes  >1 second                                    +----------------+---------+------+----------+-----------+-----------------  ----+  GSV at SFJ                yes   >500 ms      1.1                            +----------------+---------+------+----------+-----------+-----------------  ----+  GSV prox thigh            yes   >500 ms     0.87                            +----------------+---------+------+----------+-----------+-----------------  ----+  GSV mid thigh             yes   >500 ms     0.85    branching                                                                   varicosities            +----------------+---------+------+----------+-----------+-----------------  ----+  GSV dist thigh  no                          0.33                            +----------------+---------+------+----------+-----------+-----------------  ----+  GSV at knee     no                          0.29                            +----------------+---------+------+----------+-----------+-----------------  ----+  GSV prox calf             yes   >500 ms     0.30                            +----------------+---------+------+----------+-----------+-----------------  ----+  SSV Pop Fossa   no                          0.32                            +----------------+---------+------+----------+-----------+-----------------  ----+  SSV prox calf   no  0.22                            +----------------+---------+------+----------+-----------+-----------------  ----+  AASV distal               yes   >500 ms     0.86                            thigh                                                                        +----------------+---------+------+----------+-----------+-----------------    Medical Decision Making   Guadalupe Nickless is a 41 y.o. male who presents with painful right lower extremity varicose veins and leg swelling  Based on the patient's duplex study, he has reflux in the right common femoral vein, femoral vein, popliteal vein, and greater saphenous vein starting at the saphenofemoral junction and extending to the mid thigh.  He also has reflux in the greater saphenous vein in the proximal calf.  His greater saphenous vein is 8.67mm in the proximal thigh and 8.5 mm in the mid thigh with branching varicosities, so I believe he would benefit from a right GSV ablation with possible stab phlebectomy The patient has thigh-high compression stockings and will continue to wear those for 3 months, as well as using exercise and leg elevation to help with leg swelling. The patient will follow up in 3 months with one of our doctors for further GSV ablation workup.   Asako Saliba Sharin Mons, PA-C [facility] Office: [phone]  09/15/2022, 9:05 AM  Clinic MD: Edilia Bo

## 2022-09-16 ENCOUNTER — Encounter: Payer: Self-pay | Admitting: Physician Assistant

## 2022-09-16 ENCOUNTER — Ambulatory Visit (HOSPITAL_COMMUNITY)
Admission: RE | Admit: 2022-09-16 | Discharge: 2022-09-16 | Disposition: A | Discharge status: Home / Self Care | Payer: Managed Care, Other (non HMO) | Source: Ambulatory Visit | Attending: Vascular Surgery | Admitting: Vascular Surgery

## 2022-09-16 ENCOUNTER — Ambulatory Visit (INDEPENDENT_AMBULATORY_CARE_PROVIDER_SITE_OTHER): Payer: Managed Care, Other (non HMO) | Admitting: Physician Assistant

## 2022-09-16 VITALS — BP 131/93 | HR 78 | Temp 98.0°F | Ht 71.0 in | Wt 218.0 lb

## 2022-09-16 DIAGNOSIS — I8393 Asymptomatic varicose veins of bilateral lower extremities: Secondary | ICD-10-CM | POA: Insufficient documentation

## 2022-09-29 ENCOUNTER — Telehealth (INDEPENDENT_AMBULATORY_CARE_PROVIDER_SITE_OTHER): Payer: Managed Care, Other (non HMO) | Admitting: Family Medicine

## 2022-09-29 ENCOUNTER — Encounter: Payer: Self-pay | Admitting: Family Medicine

## 2022-09-29 DIAGNOSIS — J019 Acute sinusitis, unspecified: Secondary | ICD-10-CM | POA: Diagnosis not present

## 2022-09-29 DIAGNOSIS — B9689 Other specified bacterial agents as the cause of diseases classified elsewhere: Secondary | ICD-10-CM

## 2022-09-29 MED ORDER — AMOXICILLIN-POT CLAVULANATE 875-125 MG PO TABS: 1.0000 | ORAL_TABLET | Freq: Two times a day (BID) | ORAL | 0 refills | Status: AC

## 2022-09-29 MED ORDER — MOMETASONE FUROATE 50 MCG/ACT NA SUSP: 2.0000 | Freq: Every day | NASAL | 12 refills | Status: DC

## 2022-09-29 NOTE — Progress Notes (Signed)
MyChart Video visit  Subjective: [name] PCP: Ruben Ip, DO [name] Ruben Mejia is a 41 y.o. male. Patient provides verbal consent for consult held via video.  Due to COVID-19 pandemic this visit was conducted virtually. This visit type was conducted due to national recommendations for restrictions regarding the COVID-19 Pandemic (e.g. social distancing, sheltering in place) in an effort to limit this patient's exposure and mitigate transmission in our community. All issues noted in this document were discussed and addressed.  A physical exam was not performed with this format.   Location of patient: work Location of provider: [facility] Others present for call: none  1. Sinusitis Patient reports facial and dental pain L>R.  Minimal cough but reports sinus drainage.  Reports sinus headache.  Reports onset 3 days ago.  No fevers, chills, myalgia. No known sick contacts. Negative home COVID test.  No shortness of breath, bloody or brown sputum.  Used Mucinex, severe sinus capsules with no improvement.  Using netty pot twice daily as well.  Nasal drainage was initially bloody but over the past 2 days there has not been much blood.   ROS: Per HPI  No Known Allergies Past Medical History:  Diagnosis Date   Varicose veins     Current Outpatient Medications:    albuterol (VENTOLIN HFA) 108 (90 Base) MCG/ACT inhaler, Use 2 puffs 15 minutes before exercise.  May inhale 2 puffs every 6 hours as needed for wheezing or shortness of breath, Disp: 8 g, Rfl: 2   buPROPion (WELLBUTRIN XL) 300 MG 24 hr tablet, Take 1 tablet (300 mg total) by mouth daily., Disp: 90 tablet, Rfl: 0   FLUoxetine (PROZAC) 40 MG capsule, Take 1 capsule (40 mg total) by mouth daily., Disp: 90 capsule, Rfl: 3   LORazepam (ATIVAN) 0.5 MG tablet, Take 1 tablet (0.5 mg total) by mouth 2 (two) times daily as needed for anxiety., Disp: 30 tablet, Rfl: 0   ondansetron (ZOFRAN-ODT) 4 MG disintegrating tablet, Take 1 tablet (4 mg  total) by mouth every 8 (eight) hours as needed for nausea or vomiting., Disp: 20 tablet, Rfl: 0   Gen: Nontoxic-appearing male in no acute distress HEENT: No appreciable conjunctival injection.  No active rhinorrhea or facial swelling appreciated Pulm: Normal work of breathing on room air.  No dyspnea with speech or audible wheezes  Assessment/ Plan: 41 y.o. male   Acute bacterial sinusitis - Plan: amoxicillin-clavulanate (AUGMENTIN) 875-125 MG tablet, mometasone (NASONEX) 50 MCG/ACT nasal spray  Restrictions reviewed.  Reason for reevaluation discussed.  Follow-up as needed  Start time: 2:36pm End time: 2:42pm  Total time spent on patient care (including video visit/ documentation): 6 minutes  Areeb Corron Hulen Skains, DO [facility] [phone]

## 2022-10-18 ENCOUNTER — Ambulatory Visit: Payer: Managed Care, Other (non HMO) | Admitting: Family Medicine

## 2022-10-18 ENCOUNTER — Encounter: Payer: Self-pay | Admitting: Family Medicine

## 2022-10-18 VITALS — BP 123/87 | HR 70 | Temp 98.2°F | Ht 71.0 in | Wt 222.2 lb

## 2022-10-18 DIAGNOSIS — Z6830 Body mass index (BMI) 30.0-30.9, adult: Secondary | ICD-10-CM

## 2022-10-18 DIAGNOSIS — E7800 Pure hypercholesterolemia, unspecified: Secondary | ICD-10-CM

## 2022-10-18 DIAGNOSIS — Z0001 Encounter for general adult medical examination with abnormal findings: Secondary | ICD-10-CM

## 2022-10-18 DIAGNOSIS — Z0000 Encounter for general adult medical examination without abnormal findings: Secondary | ICD-10-CM

## 2022-10-18 DIAGNOSIS — F32A Depression, unspecified: Secondary | ICD-10-CM

## 2022-10-18 DIAGNOSIS — R739 Hyperglycemia, unspecified: Secondary | ICD-10-CM | POA: Diagnosis not present

## 2022-10-18 DIAGNOSIS — E669 Obesity, unspecified: Secondary | ICD-10-CM

## 2022-10-18 LAB — BAYER DCA HB A1C WAIVED: HB A1C (BAYER DCA - WAIVED): 5.3 % (ref 4.8–5.6)

## 2022-10-18 NOTE — Progress Notes (Signed)
Ruben Mejia is a 42 y.o. male presents to office today for annual physical exam examination.    Concerns today include: 1. Mood Reports ongoing depressed mood despite increased Wellbutrin dose.  Compliant with Prozac 40mg  but still has total lost in interest in things that used to bring him happiness.  Enjoys spending time with his family.  No reports of ED. Sleep is fair.  2. Difficulty losing weight Used to be very active with discgolf.  Doesn't exercise regularly now due to above.  Diet waxes and wanes.  Potentially interested in GLP therapy but does not want stimulant meds due to potential for increased anxiety.  Occupation: , Marital status: married, Substance use: none Diet: typical Set designer, Exercise: none currently Last eye exam: UTD, wears glasses Last dental exam: UTD Last colonoscopy: n/a Refills needed today: none for now Immunizations needed: none Immunization History  Administered Date(s) Administered   Influenza-Unspecified 07/19/2017   Tdap 12/17/2014     Past Medical History:  Diagnosis Date   Varicose veins    Social History   Socioeconomic History   Marital status: Married    Spouse name: Not on file   Number of children: Not on file   Years of education: Not on file   Highest education level: Not on file  Occupational History   Not on file  Tobacco Use   Smoking status: Former    Types: Cigarettes    Quit date: 02/25/2013    Years since quitting: 9.6   Smokeless tobacco: Former    Quit date: 02/26/2000  Vaping Use   Vaping Use: Former  Substance and Sexual Activity   Alcohol use: Yes    Alcohol/week: 10.0 standard drinks of alcohol    Types: 10 Cans of beer per week   Drug use: No   Sexual activity: Yes  Other Topics Concern   Not on file  Social History Narrative   Not on file   Social Determinants of Health   Financial Resource Strain: Not on file  Food Insecurity: Not on file  Transportation Needs: Not on file   Physical Activity: Not on file  Stress: Not on file  Social Connections: Not on file  Intimate Partner Violence: Not on file   No past surgical history on file. Family History  Problem Relation Age of Onset   Heart failure Father    Heart disease Father     Current Outpatient Medications:    albuterol (VENTOLIN HFA) 108 (90 Base) MCG/ACT inhaler, Use 2 puffs 15 minutes before exercise.  May inhale 2 puffs every 6 hours as needed for wheezing or shortness of breath, Disp: 8 g, Rfl: 2   buPROPion (WELLBUTRIN XL) 300 MG 24 hr tablet, Take 1 tablet (300 mg total) by mouth daily., Disp: 90 tablet, Rfl: 0   FLUoxetine (PROZAC) 40 MG capsule, Take 1 capsule (40 mg total) by mouth daily., Disp: 90 capsule, Rfl: 3   LORazepam (ATIVAN) 0.5 MG tablet, Take 1 tablet (0.5 mg total) by mouth 2 (two) times daily as needed for anxiety., Disp: 30 tablet, Rfl: 0   mometasone (NASONEX) 50 MCG/ACT nasal spray, Place 2 sprays into the nose daily., Disp: 1 each, Rfl: 12   ondansetron (ZOFRAN-ODT) 4 MG disintegrating tablet, Take 1 tablet (4 mg total) by mouth every 8 (eight) hours as needed for nausea or vomiting., Disp: 20 tablet, Rfl: 0  No Known Allergies   ROS: Review of Systems Pertinent items noted in HPI and remainder of comprehensive ROS  otherwise negative.    Physical exam BP 123/87   Pulse 70   Temp 98.2 F (36.8 C)   Ht 5\' 11"  (1.803 m)   Wt 222 lb 3.2 oz (100.8 kg)   SpO2 96%   BMI 30.99 kg/m  General appearance: alert, cooperative, appears stated age, no distress, and mildly obese Head: Normocephalic, without obvious abnormality, atraumatic Eyes: negative findings: lids and lashes normal and conjunctivae and sclerae normal Ears: normal TM's and external ear canals both ears Nose: Nares normal. Septum midline. Mucosa normal. No drainage or sinus tenderness. Throat: lips, mucosa, and tongue normal; teeth and gums normal Neck: no adenopathy, supple, symmetrical, trachea midline, and  thyroid not enlarged, symmetric, no tenderness/mass/nodules Back: symmetric, no curvature. ROM normal. No CVA tenderness. Lungs: clear to auscultation bilaterally Chest wall: no tenderness Heart: regular rate and rhythm, S1, S2 normal, no murmur, click, rub or gallop Abdomen: soft, non-tender; bowel sounds normal; no masses,  no organomegaly Extremities: extremities normal, atraumatic, no cyanosis or edema; has varicose veins in LEs Pulses: 2+ and symmetric Skin: Skin color, texture, turgor normal. No rashes or lesions Lymph nodes: Cervical, supraclavicular, and axillary nodes normal. Neurologic: Grossly normal     10/18/2022    3:16 PM 06/02/2022   10:28 AM 03/16/2019    2:03 PM  Depression screen PHQ 2/9  Decreased Interest 1 1 3   Down, Depressed, Hopeless 1 1 3   PHQ - 2 Score 2 2 6   Altered sleeping 1 1 2   Tired, decreased energy 1 1 3   Change in appetite 2 1 3   Feeling bad or failure about yourself  1 0 3  Trouble concentrating 0 2 3  Moving slowly or fidgety/restless 0 0 2  Suicidal thoughts 0 0 2  PHQ-9 Score 7 7 24   Difficult doing work/chores Somewhat difficult Somewhat difficult       10/18/2022    3:17 PM 06/02/2022   10:28 AM 03/16/2019    2:05 PM  GAD 7 : Generalized Anxiety Score  Nervous, Anxious, on Edge 1 1 3   Control/stop worrying 0 0 3  Worry too much - different things 1 1 3   Trouble relaxing 0 1 3  Restless 0 1 2  Easily annoyed or irritable 0 0 3  Afraid - awful might happen 0 0 3  Total GAD 7 Score 2 4 20   Anxiety Difficulty Not difficult at all Somewhat difficult Very difficult    Assessment/ Plan: Ruben Mejia here for annual physical exam.   Annual physical exam  Pure hypercholesterolemia - Plan: CMP14+EGFR, Lipid Panel, TSH  Elevated serum glucose - Plan: Bayer DCA Hb A1c Waived  Depressive disorder  Class 1 obesity without serious comorbidity with body mass index (BMI) of 30.0 to 30.9 in adult, unspecified obesity type  Fasting labs  ordered.  Think that lifestyle modification limited by depression that is not well controlled.   Discussed weaning from wellbutrin/ adding Vraylar vs switching off SSRI onto trintellix or alternative vs establishing care with dr Nehemiah Settle for med recommendations.  Will consult with spouse and let me know of his decision.  Discussed options for weight loss including Wegovy and Zepbound.  Will check with insurance coverage and let me know if he wishes to proceed with either.  Counseled on healthy lifestyle choices, including diet (rich in fruits, vegetables and lean meats and low in salt and simple carbohydrates) and exercise (at least 30 minutes of moderate physical activity daily).  Cristyn Crossno M. Lajuana Ripple, DO

## 2022-10-18 NOTE — Patient Instructions (Signed)
We can consider adding Vraylar for depression.  Would def recommend weaning from the Wellbutrin Alternatively, we could wean you off of the wellbutrin AND the prozac and put you on trintellix. OR we can refer to Dr Adrian Blackwater for medication recommendations.  Zepbound OR Wegovy are weight loss options.  Tips for success with Wegovy (and by success, how not to be super sick on your stomach): Eat small meals AVOID heavy foods (fried/ high in carbs like bread, pasta, rice) AVOID carbonated beverages (soda/ beer, as these can increase bloating) DOUBLE your water intake (will help you avoid constipation/ dehydration)  OEVOJJ CAN cause: Nausea Abdominal pain Increased acid reflux (sometimes presents as "sour burps") Constipation OR Diarrhea Fatigue (especially when you first start it)  Eustachian Tube Dysfunction  Eustachian tube dysfunction refers to a condition in which a blockage develops in the narrow passage that connects the middle ear to the back of the nose (eustachian tube). The eustachian tube regulates air pressure in the middle ear by letting air move between the ear and nose. It also helps to drain fluid from the middle ear space. Eustachian tube dysfunction can affect one or both ears. When the eustachian tube does not function properly, air pressure, fluid, or both can build up in the middle ear. What are the causes? This condition occurs when the eustachian tube becomes blocked or cannot open normally. Common causes of this condition include: Ear infections. Colds and other infections that affect the nose, mouth, and throat (upper respiratory tract). Allergies. Irritation from cigarette smoke. Irritation from stomach acid coming up into the esophagus (gastroesophageal reflux). The esophagus is the part of the body that moves food from the mouth to the stomach. Sudden changes in air pressure, such as from descending in an airplane or scuba diving. Abnormal growths in the nose or  throat, such as: Growths that line the nose (nasal polyps). Abnormal growth of cells (tumors). Enlarged tissue at the back of the throat (adenoids). What increases the risk? You are more likely to develop this condition if: You smoke. You are overweight. You are a child who has: Certain birth defects of the mouth, such as cleft palate. Large tonsils or adenoids. What are the signs or symptoms? Common symptoms of this condition include: A feeling of fullness in the ear. Ear pain. Clicking or popping noises in the ear. Ringing in the ear (tinnitus). Hearing loss. Loss of balance. Dizziness. Symptoms may get worse when the air pressure around you changes, such as when you travel to an area of high elevation, fly on an airplane, or go scuba diving. How is this diagnosed? This condition may be diagnosed based on: Your symptoms. A physical exam of your ears, nose, and throat. Tests, such as those that measure: The movement of your eardrum. Your hearing (audiometry). How is this treated? Treatment depends on the cause and severity of your condition. In mild cases, you may relieve your symptoms by moving air into your ears. This is called "popping the ears." In more severe cases, or if you have symptoms of fluid in your ears, treatment may include: Medicines to relieve congestion (decongestants). Medicines that treat allergies (antihistamines). Nasal sprays or ear drops that contain medicines that reduce swelling (steroids). A procedure to drain the fluid in your eardrum. In this procedure, a small tube may be placed in the eardrum to: Drain the fluid. Restore the air in the middle ear space. A procedure to insert a balloon device through the nose to inflate the  opening of the eustachian tube (balloon dilation). Follow these instructions at home: Lifestyle Do not do any of the following until your health care provider approves: Travel to high altitudes. Fly in airplanes. Work in a  Pension scheme manager or room. Scuba dive. Do not use any products that contain nicotine or tobacco. These products include cigarettes, chewing tobacco, and vaping devices, such as e-cigarettes. If you need help quitting, ask your health care provider. Keep your ears dry. Wear fitted earplugs during showering and bathing. Dry your ears completely after. General instructions Take over-the-counter and prescription medicines only as told by your health care provider. Use techniques to help pop your ears as recommended by your health care provider. These may include: Chewing gum. Yawning. Frequent, forceful swallowing. Closing your mouth, holding your nose closed, and gently blowing as if you are trying to blow air out of your nose. Keep all follow-up visits. This is important. Contact a health care provider if: Your symptoms do not go away after treatment. Your symptoms come back after treatment. You are unable to pop your ears. You have: A fever. Pain in your ear. Pain in your head or neck. Fluid draining from your ear. Your hearing suddenly changes. You become very dizzy. You lose your balance. Get help right away if: You have a sudden, severe increase in any of your symptoms. Summary Eustachian tube dysfunction refers to a condition in which a blockage develops in the eustachian tube. It can be caused by ear infections, allergies, inhaled irritants, or abnormal growths in the nose or throat. Symptoms may include ear pain or fullness, hearing loss, or ringing in the ears. Mild cases are treated with techniques to unblock the ears, such as yawning or chewing gum. More severe cases are treated with medicines or procedures. This information is not intended to replace advice given to you by your health care provider. Make sure you discuss any questions you have with your health care provider. Document Revised: 12/08/2020 Document Reviewed: 12/08/2020 Elsevier Patient Education  [employer].

## 2022-10-19 LAB — CMP14+EGFR
ALT: 22 IU/L (ref 0–44)
AST: 24 IU/L (ref 0–40)
Albumin/Globulin Ratio: 2 (ref 1.2–2.2)
Albumin: 4.8 g/dL (ref 4.1–5.1)
Alkaline Phosphatase: 80 IU/L (ref 44–121)
BUN/Creatinine Ratio: 13 (ref 9–20)
BUN: 14 mg/dL (ref 6–24)
Bilirubin Total: 0.4 mg/dL (ref 0.0–1.2)
CO2: 23 mmol/L (ref 20–29)
Calcium: 9.8 mg/dL (ref 8.7–10.2)
Chloride: 98 mmol/L (ref 96–106)
Creatinine, Ser: 1.05 mg/dL (ref 0.76–1.27)
Globulin, Total: 2.4 g/dL (ref 1.5–4.5)
Glucose: 82 mg/dL (ref 70–99)
Potassium: 3.8 mmol/L (ref 3.5–5.2)
Sodium: 138 mmol/L (ref 134–144)
Total Protein: 7.2 g/dL (ref 6.0–8.5)
eGFR: 91 mL/min/{1.73_m2} (ref >59)

## 2022-10-19 LAB — LIPID PANEL
Chol/HDL Ratio: 3.3 ratio (ref 0.0–5.0)
Cholesterol, Total: 217 mg/dL — ABNORMAL HIGH (ref 100–199)
HDL: 66 mg/dL (ref >39)
LDL Chol Calc (NIH): 138 mg/dL — ABNORMAL HIGH (ref 0–99)
Triglycerides: 75 mg/dL (ref 0–149)
VLDL Cholesterol Cal: 13 mg/dL (ref 5–40)

## 2022-10-19 LAB — TSH: TSH: 1.35 u[IU]/mL (ref 0.450–4.500)

## 2022-10-21 ENCOUNTER — Telehealth: Payer: Self-pay | Admitting: Family Medicine

## 2022-10-31 ENCOUNTER — Encounter: Payer: Self-pay | Admitting: Family Medicine

## 2022-10-31 DIAGNOSIS — F32A Depression, unspecified: Secondary | ICD-10-CM

## 2022-10-31 DIAGNOSIS — F411 Generalized anxiety disorder: Secondary | ICD-10-CM

## 2022-10-31 MED ORDER — FLUOXETINE HCL 40 MG PO CAPS: 40.0000 mg | ORAL_CAPSULE | Freq: Every day | ORAL | 3 refills | Status: DC

## 2022-10-31 MED ORDER — BUPROPION HCL ER (XL) 300 MG PO TB24: 300.0000 mg | ORAL_TABLET | Freq: Every day | ORAL | 3 refills | Status: DC

## 2022-12-22 ENCOUNTER — Ambulatory Visit: Payer: Managed Care, Other (non HMO) | Admitting: Vascular Surgery

## 2022-12-22 ENCOUNTER — Encounter: Payer: Self-pay | Admitting: Vascular Surgery

## 2022-12-22 VITALS — BP 120/85 | HR 78 | Temp 98.1°F | Resp 20 | Ht 71.0 in | Wt 215.0 lb

## 2022-12-22 DIAGNOSIS — I8393 Asymptomatic varicose veins of bilateral lower extremities: Secondary | ICD-10-CM

## 2022-12-22 NOTE — Progress Notes (Signed)
Patient ID: Ruben Mejia, male   DOB: [birth year], 42 y.o.   MRN: [id number]  Reason for Consult: Follow-up   Referred by Janora Norlander, DO  Subjective:     HPI:  Ruben Mejia is a 42 y.o. male with over 20-year history large varicose veins in his right lower extremity.  He actually does not have any family history of varicose veins in either of his parents.  He has not had any bleeding or clotting and specifically denies any history of DVT.  He has been compliant with thigh-high compression stockings.  He states that he is making and throbbing at the site of the veins and pain mostly in his medial lateral thigh area overlying the veins that is worse throughout the day and only somewhat relieved with compression stockings.  He also has swelling of the right lower extremity relative to the left.  He has minor amount of varicosities on the left leg but do not really cause him issues.  Past Medical History:  Diagnosis Date   Varicose veins    Family History  Problem Relation Age of Onset   Heart failure Father    Heart disease Father    History reviewed. No pertinent surgical history.  Short Social History:  Social History   Tobacco Use   Smoking status: Former    Types: Cigarettes    Quit date: 02/25/2013    Years since quitting: 9.8   Smokeless tobacco: Former    Quit date: 02/26/2000  Substance Use Topics   Alcohol use: Yes    Alcohol/week: 10.0 standard drinks of alcohol    Types: 10 Cans of beer per week    No Known Allergies  Current Outpatient Medications  Medication Sig Dispense Refill   albuterol (VENTOLIN HFA) 108 (90 Base) MCG/ACT inhaler Use 2 puffs 15 minutes before exercise.  May inhale 2 puffs every 6 hours as needed for wheezing or shortness of breath 8 g 2   amoxicillin-clavulanate (AUGMENTIN) 875-125 MG tablet Take 1 tablet by mouth 2 (two) times daily.     buPROPion (WELLBUTRIN XL) 300 MG 24 hr tablet Take 1 tablet (300 mg total) by mouth daily. 90  tablet 3   FLUoxetine (PROZAC) 40 MG capsule Take 1 capsule (40 mg total) by mouth daily. 90 capsule 3   LORazepam (ATIVAN) 0.5 MG tablet Take 1 tablet (0.5 mg total) by mouth 2 (two) times daily as needed for anxiety. 30 tablet 0   ondansetron (ZOFRAN-ODT) 4 MG disintegrating tablet Take 1 tablet (4 mg total) by mouth every 8 (eight) hours as needed for nausea or vomiting. 20 tablet 0   No current facility-administered medications for this visit.    Review of Systems  Constitutional:  Constitutional negative. HENT: HENT negative.  Eyes: Eyes negative.  Respiratory: Respiratory negative.  Cardiovascular: Positive for leg swelling.  GI: Gastrointestinal negative.  Musculoskeletal: Positive for leg pain.  Skin: Skin negative.  Neurological: Neurological negative. Hematologic: Hematologic/lymphatic negative.  Psychiatric: Psychiatric negative.        Objective:  Objective   Vitals:   12/22/22 1507  BP: 120/85  Pulse: 78  Resp: 20  Temp: 98.1 F (36.7 C)  SpO2: (!) 78%  Weight: 215 lb (97.5 kg)  Height: '5\' 11"'$  (1.803 m)   Body mass index is 29.99 kg/m.  Physical Exam HENT:     Head: Normocephalic.     Mouth/Throat:     Mouth: Mucous membranes are moist.  Cardiovascular:     Rate  and Rhythm: Normal rate.     Pulses: Normal pulses.  Pulmonary:     Effort: Pulmonary effort is normal.  Abdominal:     General: Abdomen is flat.  Musculoskeletal:     Cervical back: Normal range of motion.     Right lower leg: Edema present.     Left lower leg: No edema.  Skin:    General: Skin is warm and dry.  Neurological:     General: No focal deficit present.     Mental Status: He is alert.  Psychiatric:        Mood and Affect: Mood normal.        Thought Content: Thought content normal.        Judgment: Judgment normal.          Data: Venous Reflux Times  +----------------+---------+------+----------+-----------+-----------------  ----+  RIGHT          Reflux  NoReflux  Reflux   Diameter  Comments                                          Yes     Time       cms                            +----------------+---------+------+----------+-----------+-----------------  ----+  CFV                      yes  >1 second                                    +----------------+---------+------+----------+-----------+-----------------  ----+  FV prox         no                                                          +----------------+---------+------+----------+-----------+-----------------  ----+  FV mid                    yes  >1 second            duplicated vein         +----------------+---------+------+----------+-----------+-----------------  ----+  FV dist                   yes  >1 second            duplicated vein         +----------------+---------+------+----------+-----------+-----------------  ----+  Popliteal                yes  >1 second                                    +----------------+---------+------+----------+-----------+-----------------  ----+  GSV at SFJ                yes   >500 ms      1.1                            +----------------+---------+------+----------+-----------+-----------------  ----+  GSV prox thigh            yes   >500 ms     0.87                            +----------------+---------+------+----------+-----------+-----------------  ----+  GSV mid thigh             yes   >500 ms     0.85    branching                                                                   varicosities            +----------------+---------+------+----------+-----------+-----------------  ----+  GSV dist thigh  no                          0.33                            +----------------+---------+------+----------+-----------+-----------------  ----+  GSV at knee     no                          0.29                             +----------------+---------+------+----------+-----------+-----------------  ----+  GSV prox calf             yes   >500 ms     0.30                            +----------------+---------+------+----------+-----------+-----------------  ----+  SSV Pop Fossa   no                          0.32                            +----------------+---------+------+----------+-----------+-----------------  ----+  SSV prox calf   no                          0.22                            +----------------+---------+------+----------+-----------+-----------------  ----+  AASV distal               yes   >500 ms     0.86                            thigh                                                                       +----------------+---------+------+----------+-----------+-----------------  ----+  Summary:  Right:  - No evidence of deep vein thrombosis seen in the right lower extremity,  from the common femoral through the popliteal veins.  - No evidence of superficial venous thrombosis in the right lower  extremity.    - No evidence of superficial venous reflux seen in the right short  saphenous vein.    - Venous reflux is noted in the right common femoral vein.  - Venous reflux is noted in the right sapheno-femoral junction.  - Venous reflux is noted in the right greater saphenous vein in the thigh.  - Venous reflux is noted in the right greater saphenous vein in the calf.  - Venous reflux is noted in the right femoral vein.  - Venous reflux is noted in the right popliteal vein.      Assessment/Plan:    42 year old male with C3 right lower extremity venous disease with large refluxing great saphenous vein that I evaluated the bedside today.  He has been compliant with compression stockings.  He has significant varicosities throughout the medial and lateral thigh as well as the posterior thigh and extending down onto the medial right leg  which see directly into the great saphenous vein.  Plan will be for great saphenous vein ablation this will likely be for only a short segment approximate 15 to 20 cm mostly above the level of varicosities on the right medial thigh and then performed greater than 20 stab phlebectomies.  We discussed the risk particularly of DVT and the benefits and need for compression stockings both before and after the procedure.  Patient demonstrates good understanding we will get him scheduled for Thursday in the near future.    Waynetta Sandy MD [facility]

## 2022-12-30 ENCOUNTER — Other Ambulatory Visit: Payer: Self-pay | Admitting: *Deleted

## 2022-12-30 DIAGNOSIS — I83811 Varicose veins of right lower extremities with pain: Secondary | ICD-10-CM

## 2023-02-10 ENCOUNTER — Ambulatory Visit: Payer: Managed Care, Other (non HMO) | Admitting: Vascular Surgery

## 2023-02-10 ENCOUNTER — Encounter: Payer: Self-pay | Admitting: Vascular Surgery

## 2023-02-10 VITALS — BP 116/85 | HR 72 | Temp 98.5°F | Resp 16 | Ht 71.0 in | Wt 212.0 lb

## 2023-02-10 DIAGNOSIS — I83811 Varicose veins of right lower extremities with pain: Secondary | ICD-10-CM | POA: Diagnosis not present

## 2023-02-10 HISTORY — PX: ENDOVENOUS ABLATION SAPHENOUS VEIN W/ LASER: SUR449

## 2023-02-10 NOTE — Progress Notes (Signed)
Patient name: Ruben Mejia MRN: [id number] DOB: 1981-01-13 Sex: male  REASON FOR VISIT: Treatment of symptomatic right lower extremity varicose veins and swelling with great saphenous vein ablation and greater than 20 stab phlebectomies  HPI: Ruben Mejia is a 42 y.o. 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  He also has associated swelling in the right lower extremity and has been compliant with compression stockings and presents for treatment today.  Current Outpatient Medications  Medication Sig Dispense Refill   albuterol (VENTOLIN HFA) 108 (90 Base) MCG/ACT inhaler Use 2 puffs 15 minutes before exercise.  May inhale 2 puffs every 6 hours as needed for wheezing or shortness of breath 8 g 2   amoxicillin-clavulanate (AUGMENTIN) 875-125 MG tablet Take 1 tablet by mouth 2 (two) times daily.     buPROPion (WELLBUTRIN XL) 300 MG 24 hr tablet Take 1 tablet (300 mg total) by mouth daily. 90 tablet 3   FLUoxetine (PROZAC) 40 MG capsule Take 1 capsule (40 mg total) by mouth daily. 90 capsule 3   LORazepam (ATIVAN) 0.5 MG tablet Take 1 tablet (0.5 mg total) by mouth 2 (two) times daily as needed for anxiety. 30 tablet 0   ondansetron (ZOFRAN-ODT) 4 MG disintegrating tablet Take 1 tablet (4 mg total) by mouth every 8 (eight) hours as needed for nausea or vomiting. 20 tablet 0   No current facility-administered medications for this visit.    PHYSICAL EXAM: Vitals:   02/10/23 1022  BP: 116/85  Pulse: 72  Resp: 16  Temp: 98.5 F (36.9 C)  SpO2: 99%    Awake alert and oriented Nonlabored respirations Multiple areas of varicosities marked on the right lower extremity with the patient standing and great saphenous vein marked with the patient supine  PROCEDURE: Right greater saphenous vein laser ablation totaling 12 cm and stab phlebectomy greater than 20 of right lower extremity    TECHNIQUE: Mr. Ruben Mejia was brought to the exam room and was  initially placed in the standing position where his varicosities were marked throughout the right lower extremity.  He was then laid supine on the procedural table and ultrasound was used to identify the right greater saphenous vein which was noted to be within the fascia in the right upper thigh above the level of the varicosities.  He was then sterilely prepped and draped in the right lower extremity circumferentially. Ultrasound was used to identify the right greater saphenous vein and this was cannulated in the upper thigh just above the varicosities using a micropuncture needle followed by a wire and a micropuncture sheath was placed.  A J-wire was then placed under fluoroscopic guidance to approximately 2.5 cm from the saphenofemoral junction and a 45 cm sheath was then placed with ultrasound guidance.  We again confirmed positioning with ultrasound.  Tumescent anesthesia was administered along the entire length of the planned treatment segment of the right greater saphenous vein.  The right greater saphenous vein was then ablated for a total of 12 cm and ultrasound was used to compress the common femoral vein when this was performed given that there were no side branches of the saphenous vein identifiable.  After ablation the right common femoral vein was checked and noted to be compressible.   Attention was then turned to the varicosities on the right lower extremity which were quite extensive.  We performed greater than 20 stab phlebectomies of the right lower extremity located circumferentially around the  thigh, down the lateral thigh wound to the lateral and posterior leg in the popliteal fossa and also on the medial thigh, medial leg and 1 area on the medial foot.  This was done with 11 blade stab followed by grasping with crochet hook and hemostats and all veins were removed.  At completion Steri-Strips were placed and a compressive dressing was wrapped around the right lower extremity.  He did  tolerate this without immediate complication.     Plan will be for follow-up in 2 weeks with post ablation duplex to rule out deep venous thrombosis.   Lemar Livings [facility] [phone]

## 2023-02-10 NOTE — Progress Notes (Signed)
     Laser Ablation Procedure    Date: 02/10/2023   Ruben Mejia DOB:November 10, 1980  Consent signed: Yes      Surgeon: Lemar Livings MD   Procedure: Laser Ablation: right Greater Saphenous Vein  BP 116/85 (BP Location: Left Arm, Patient Position: Sitting, Cuff Size: Normal)   Pulse 72   Temp 98.5 F (36.9 C) (Temporal)   Resp 16   Ht 5\' 11"  (1.803 m)   Wt 212 lb (96.2 kg)   SpO2 99%   BMI 29.57 kg/m   Tumescent Anesthesia: 1000 cc 0.9% NaCl with 50 cc Lidocaine HCL 1%  and 15 cc 8.4% NaHCO3  Local Anesthesia: 30 cc Lidocaine HCL and NaHCO3 (ratio 2:1)  7 watts continuous mode     Total energy: 606 Joules     Total time: 86 seconds  Treatment Length 12 cm   Laser Fiber Ref. #  [id number]   Lot #  [id number]   Stab Phlebectomy: >20 Sites: Thigh, Calf, and Ankle  Patient tolerated procedure well  Notes: All staff members wore facial masks.    Description of Procedure:  After marking the course of the secondary varicosities, the patient was placed on the operating table in the supine position, and the right leg was prepped and draped in sterile fashion.   Local anesthetic was administered and under ultrasound guidance the saphenous vein was accessed with a micro needle and guide wire; then the mirco puncture sheath was placed.  A guide wire was inserted saphenofemoral junction , followed by a 5 french sheath.  The position of the sheath and then the laser fiber below the junction was confirmed using the ultrasound.  Tumescent anesthesia was administered along the course of the saphenous vein using ultrasound guidance. The patient was placed in Trendelenburg position and protective laser glasses were placed on patient and staff, and the laser was fired at 7 watts continuous mode for a total of 606 joules.   For stab phlebectomies, local anesthetic was administered at the previously marked varicosities, and tumescent anesthesia was administered around the vessels.  Greater than 20 stab  wounds were made using the tip of an 11 blade. And using the vein hook, the phlebectomies were performed using a hemostat to avulse the varicosities.  Adequate hemostasis was achieved.     Steri strips were applied to the stab wounds and ABD pads and thigh high compression stockings were applied.  Ace wrap bandages were applied over the phlebectomy sites and at the top of the saphenofemoral junction. Blood loss was less than 15 cc.  Discharge instructions reviewed with patient and hardcopy of discharge instructions given to patient to take home. The patient was transported via wheelchair to his car out of the operating room having tolerated the procedure well.

## 2023-02-16 ENCOUNTER — Ambulatory Visit: Payer: Managed Care, Other (non HMO) | Admitting: Family Medicine

## 2023-02-23 ENCOUNTER — Encounter: Payer: Managed Care, Other (non HMO) | Admitting: Family Medicine

## 2023-02-24 ENCOUNTER — Encounter: Payer: Self-pay | Admitting: Vascular Surgery

## 2023-02-24 ENCOUNTER — Ambulatory Visit: Payer: Managed Care, Other (non HMO) | Admitting: Vascular Surgery

## 2023-02-24 ENCOUNTER — Ambulatory Visit (HOSPITAL_COMMUNITY)
Admission: RE | Admit: 2023-02-24 | Discharge: 2023-02-24 | Disposition: A | Discharge status: Home / Self Care | Payer: Managed Care, Other (non HMO) | Source: Ambulatory Visit | Attending: Vascular Surgery | Admitting: Vascular Surgery

## 2023-02-24 VITALS — BP 107/74 | HR 71 | Temp 98.7°F | Resp 16 | Ht 71.0 in | Wt 215.0 lb

## 2023-02-24 DIAGNOSIS — I83811 Varicose veins of right lower extremities with pain: Secondary | ICD-10-CM | POA: Insufficient documentation

## 2023-02-24 NOTE — Progress Notes (Signed)
     Subjective:     Patient ID: Ruben Mejia, male   DOB: 06/01/1981, 42 y.o.   MRN: [id number]  HPI 42 year old male follows up at the right greater saphenous vein ablation and stab phlebectomy for greater than 20.  He continues to wear compression stockings with much improvement in the swelling and feeling in his leg.  He did not tolerate anti-inflammatory medications.   Review of Systems No real complaints today    Objective:   Physical Exam Vitals:   02/24/23 1015  BP: 107/74  Pulse: 71  Resp: 16  Temp: 98.7 F (37.1 C)  SpO2: 97%          Assessment/plan     42 year old male status post right greater saphenous vein ablation and multiple stab phlebectomies probably greater than 50 with significant improvement in the appearance and feeling his leg.  He will continue compression stockings and can see me on an as-needed basis.        Sharona Rovner C. Randie Heinz, MD [facility] Office: [phone] Pager: [phone]

## 2023-08-23 ENCOUNTER — Other Ambulatory Visit: Payer: Self-pay | Admitting: Family Medicine

## 2023-08-23 DIAGNOSIS — J45990 Exercise induced bronchospasm: Secondary | ICD-10-CM

## 2023-11-04 ENCOUNTER — Encounter: Payer: Self-pay | Admitting: Family Medicine

## 2023-11-04 ENCOUNTER — Other Ambulatory Visit: Payer: Self-pay | Admitting: Family Medicine

## 2023-11-04 DIAGNOSIS — F411 Generalized anxiety disorder: Secondary | ICD-10-CM

## 2023-11-04 DIAGNOSIS — F32A Depression, unspecified: Secondary | ICD-10-CM

## 2023-11-04 NOTE — Telephone Encounter (Signed)
[place] NT[employer] last OV 10/18/22 NO RF sent to pharmacy last OV greater than a year

## 2023-11-04 NOTE — Telephone Encounter (Signed)
NA. Letter mailed

## 2023-11-16 ENCOUNTER — Other Ambulatory Visit: Payer: Self-pay | Admitting: Family Medicine

## 2023-11-16 DIAGNOSIS — F411 Generalized anxiety disorder: Secondary | ICD-10-CM

## 2023-11-16 DIAGNOSIS — F32A Depression, unspecified: Secondary | ICD-10-CM

## 2023-11-16 MED ORDER — BUPROPION HCL ER (XL) 300 MG PO TB24: 300.0000 mg | ORAL_TABLET | Freq: Every day | ORAL | 0 refills | Status: AC

## 2023-11-16 MED ORDER — FLUOXETINE HCL 40 MG PO CAPS: 40.0000 mg | ORAL_CAPSULE | Freq: Every day | ORAL | 0 refills | Status: AC

## 2023-11-16 NOTE — Telephone Encounter (Signed)
 Copied from CRM [id number]. Topic: Clinical - Medication Refill >> Nov 16, 2023 12:28 PM Antonio DEL wrote: Most Recent Primary Care Visit:  Provider: JOLINDA NORENE HERO  Department: [facility]  Visit Type: PHYSICAL  Date: 10/18/2022  Medication: buPROPion  (WELLBUTRIN  XL) 300 MG 24 hr tablet FLUoxetine  (PROZAC ) 40 MG capsule  Has the patient contacted their pharmacy?  (Agent: If no, request that the patient contact the pharmacy for the refill. If patient does not wish to contact the pharmacy document the reason why and proceed with request.) (Agent: If yes, when and what did the pharmacy advise?)  Is this the correct pharmacy for this prescription? Yes If no, delete pharmacy and type the correct one.  This is the patient's preferred pharmacy:  [id number] - [place], [place] - [address] [place] [address] Phone: [phone] Fax: [phone]   Has the prescription been filled recently? Yes  Is the patient out of the medication? No. Has 2 days left  Has the patient been seen for an appointment in the last year OR does the patient have an upcoming appointment? No  Can we respond through MyChart? Yes  Agent: Please be advised that Rx refills may take up to 3 business days. We ask that you follow-up with your pharmacy.

## 2023-11-16 NOTE — Telephone Encounter (Signed)
 Last Fill: Wellbutrin : 10/31/22     Prozac : 10/31/22  Last OV: 10/18/22 Next OV: None Scheduled  Routing to provider for review/authorization.

## 2023-11-24 ENCOUNTER — Telehealth (HOSPITAL_COMMUNITY): Payer: Self-pay | Admitting: Pharmacy Technician

## 2023-11-24 ENCOUNTER — Other Ambulatory Visit (HOSPITAL_COMMUNITY): Payer: Self-pay

## 2023-11-24 NOTE — Telephone Encounter (Signed)
Pharmacy Patient Advocate Encounter   Received notification from  ONBASE  that prior authorization for buPROPion 300 MG 24 hr tablet is required/requested.   Insurance verification completed.   The patient is insured through [facility] .   Per test claim: PA required; PA submitted to above mentioned insurance via Prompt PA Key/confirmation #/EOC [id number] Status is pending

## 2023-11-28 ENCOUNTER — Other Ambulatory Visit (HOSPITAL_COMMUNITY): Payer: Self-pay

## 2023-12-01 ENCOUNTER — Other Ambulatory Visit (HOSPITAL_COMMUNITY): Payer: Self-pay

## 2023-12-05 ENCOUNTER — Other Ambulatory Visit (HOSPITAL_COMMUNITY): Payer: Self-pay

## 2023-12-12 ENCOUNTER — Other Ambulatory Visit (HOSPITAL_COMMUNITY): Payer: Self-pay

## 2023-12-21 ENCOUNTER — Other Ambulatory Visit (HOSPITAL_COMMUNITY): Payer: Self-pay

## 2023-12-21 NOTE — Telephone Encounter (Signed)
 Pharmacy Patient Advocate Encounter   Received notification from Onbase that prior authorization for BUPROPION 300MG  24HR TABLETS is required/requested.   Insurance verification completed.   The patient is insured through [facility] .   Per test claim: Refill too soon. PA is not needed at this time. Medication was filled 11/16/2023. Next eligible fill date is 01/23/2024.

## 2024-02-09 ENCOUNTER — Other Ambulatory Visit: Payer: Self-pay | Admitting: Family Medicine

## 2024-02-09 ENCOUNTER — Encounter: Payer: Self-pay | Admitting: Family Medicine

## 2024-02-09 DIAGNOSIS — F411 Generalized anxiety disorder: Secondary | ICD-10-CM

## 2024-02-09 DIAGNOSIS — F32A Depression, unspecified: Secondary | ICD-10-CM

## 2024-02-09 NOTE — Telephone Encounter (Signed)
 [place] NT[employer] last OV 10/18/22 NO RF sent to pharmacy last OV greater than a year

## 2024-02-09 NOTE — Telephone Encounter (Signed)
 LMTCB to schedule appt Letter mailed
